# Patient Record
Sex: Male | Born: 1940 | Race: White | Hispanic: No | Marital: Married | Smoking: Former smoker
Health system: Southern US, Community
[De-identification: ages and names within clinical notes are randomized; demographics above are authoritative.]

## PROBLEM LIST (undated history)

## (undated) DIAGNOSIS — K635 Polyp of colon: Secondary | ICD-10-CM

## (undated) DIAGNOSIS — M1990 Unspecified osteoarthritis, unspecified site: Secondary | ICD-10-CM

## (undated) DIAGNOSIS — T7840XA Allergy, unspecified, initial encounter: Secondary | ICD-10-CM

## (undated) DIAGNOSIS — F419 Anxiety disorder, unspecified: Secondary | ICD-10-CM

## (undated) DIAGNOSIS — B029 Zoster without complications: Secondary | ICD-10-CM

## (undated) DIAGNOSIS — C801 Malignant (primary) neoplasm, unspecified: Secondary | ICD-10-CM

## (undated) DIAGNOSIS — K219 Gastro-esophageal reflux disease without esophagitis: Secondary | ICD-10-CM

## (undated) DIAGNOSIS — C679 Malignant neoplasm of bladder, unspecified: Secondary | ICD-10-CM

## (undated) DIAGNOSIS — N400 Enlarged prostate without lower urinary tract symptoms: Secondary | ICD-10-CM

## (undated) DIAGNOSIS — I10 Essential (primary) hypertension: Secondary | ICD-10-CM

## (undated) HISTORY — DX: Anxiety disorder, unspecified: F41.9

## (undated) HISTORY — DX: Unspecified osteoarthritis, unspecified site: M19.90

## (undated) HISTORY — PX: CATARACT EXTRACTION, BILATERAL: SHX1313

## (undated) HISTORY — DX: Gastro-esophageal reflux disease without esophagitis: K21.9

## (undated) HISTORY — PX: TRANSURETHRAL RESECTION OF PROSTATE: SHX73

## (undated) HISTORY — DX: Malignant neoplasm of bladder, unspecified: C67.9

## (undated) HISTORY — DX: Allergy, unspecified, initial encounter: T78.40XA

## (undated) HISTORY — PX: COLONOSCOPY: SHX174

## (undated) HISTORY — PX: OTHER SURGICAL HISTORY: SHX169

## (undated) HISTORY — DX: Malignant (primary) neoplasm, unspecified: C80.1

## (undated) HISTORY — PX: POLYPECTOMY: SHX149

## (undated) HISTORY — DX: Essential (primary) hypertension: I10

## (undated) HISTORY — DX: Zoster without complications: B02.9

## (undated) HISTORY — DX: Polyp of colon: K63.5

## (undated) HISTORY — PX: ELECTROCARDIOGRAM: SHX264

## (undated) HISTORY — DX: Benign prostatic hyperplasia without lower urinary tract symptoms: N40.0

---
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
## 1980-08-21 HISTORY — PX: CHOLECYSTECTOMY: SHX55

## 1995-08-22 HISTORY — PX: DOPPLER ECHOCARDIOGRAPHY: SHX263

## 2005-10-31 ENCOUNTER — Ambulatory Visit: Payer: Self-pay | Admitting: Internal Medicine

## 2005-11-14 ENCOUNTER — Ambulatory Visit: Payer: Self-pay | Admitting: Internal Medicine

## 2005-12-19 ENCOUNTER — Ambulatory Visit: Payer: Self-pay | Admitting: Internal Medicine

## 2006-10-20 HISTORY — PX: OTHER SURGICAL HISTORY: SHX169

## 2006-10-23 ENCOUNTER — Emergency Department (HOSPITAL_COMMUNITY): Admission: EM | Admit: 2006-10-23 | Discharge: 2006-10-23 | Payer: Self-pay | Admitting: Emergency Medicine

## 2006-10-24 ENCOUNTER — Encounter (INDEPENDENT_AMBULATORY_CARE_PROVIDER_SITE_OTHER): Payer: Self-pay | Admitting: Internal Medicine

## 2006-10-25 ENCOUNTER — Encounter (INDEPENDENT_AMBULATORY_CARE_PROVIDER_SITE_OTHER): Payer: Self-pay | Admitting: Internal Medicine

## 2006-10-26 ENCOUNTER — Encounter (INDEPENDENT_AMBULATORY_CARE_PROVIDER_SITE_OTHER): Payer: Self-pay | Admitting: Specialist

## 2006-10-26 ENCOUNTER — Encounter (INDEPENDENT_AMBULATORY_CARE_PROVIDER_SITE_OTHER): Payer: Self-pay | Admitting: Internal Medicine

## 2006-10-26 ENCOUNTER — Observation Stay (HOSPITAL_COMMUNITY): Admission: RE | Admit: 2006-10-26 | Discharge: 2006-10-27 | Payer: Self-pay | Admitting: Urology

## 2006-12-21 ENCOUNTER — Encounter (INDEPENDENT_AMBULATORY_CARE_PROVIDER_SITE_OTHER): Payer: Self-pay | Admitting: Internal Medicine

## 2007-01-24 ENCOUNTER — Ambulatory Visit: Payer: Self-pay | Admitting: Family Medicine

## 2007-01-24 DIAGNOSIS — J189 Pneumonia, unspecified organism: Secondary | ICD-10-CM

## 2007-01-29 ENCOUNTER — Telehealth (INDEPENDENT_AMBULATORY_CARE_PROVIDER_SITE_OTHER): Payer: Self-pay | Admitting: *Deleted

## 2007-02-08 ENCOUNTER — Encounter (INDEPENDENT_AMBULATORY_CARE_PROVIDER_SITE_OTHER): Payer: Self-pay | Admitting: Urology

## 2007-02-08 ENCOUNTER — Ambulatory Visit (HOSPITAL_BASED_OUTPATIENT_CLINIC_OR_DEPARTMENT_OTHER): Admission: RE | Admit: 2007-02-08 | Discharge: 2007-02-08 | Payer: Self-pay | Admitting: Urology

## 2007-02-13 ENCOUNTER — Encounter: Payer: Self-pay | Admitting: Internal Medicine

## 2007-02-13 DIAGNOSIS — I10 Essential (primary) hypertension: Secondary | ICD-10-CM

## 2007-02-13 DIAGNOSIS — Z8639 Personal history of other endocrine, nutritional and metabolic disease: Secondary | ICD-10-CM

## 2007-02-13 DIAGNOSIS — C679 Malignant neoplasm of bladder, unspecified: Secondary | ICD-10-CM

## 2007-02-13 DIAGNOSIS — K219 Gastro-esophageal reflux disease without esophagitis: Secondary | ICD-10-CM | POA: Insufficient documentation

## 2007-02-13 DIAGNOSIS — M1990 Unspecified osteoarthritis, unspecified site: Secondary | ICD-10-CM | POA: Insufficient documentation

## 2007-02-13 DIAGNOSIS — F411 Generalized anxiety disorder: Secondary | ICD-10-CM | POA: Insufficient documentation

## 2007-02-13 DIAGNOSIS — J309 Allergic rhinitis, unspecified: Secondary | ICD-10-CM | POA: Insufficient documentation

## 2007-02-13 DIAGNOSIS — G43109 Migraine with aura, not intractable, without status migrainosus: Secondary | ICD-10-CM | POA: Insufficient documentation

## 2007-02-13 DIAGNOSIS — Z862 Personal history of diseases of the blood and blood-forming organs and certain disorders involving the immune mechanism: Secondary | ICD-10-CM

## 2007-02-13 DIAGNOSIS — N400 Enlarged prostate without lower urinary tract symptoms: Secondary | ICD-10-CM

## 2007-02-19 ENCOUNTER — Ambulatory Visit: Payer: Self-pay | Admitting: Family Medicine

## 2007-03-19 ENCOUNTER — Ambulatory Visit: Payer: Self-pay | Admitting: Family Medicine

## 2007-04-23 ENCOUNTER — Ambulatory Visit: Payer: Self-pay | Admitting: Family Medicine

## 2007-05-17 ENCOUNTER — Encounter (INDEPENDENT_AMBULATORY_CARE_PROVIDER_SITE_OTHER): Payer: Self-pay | Admitting: Internal Medicine

## 2007-05-17 ENCOUNTER — Telehealth (INDEPENDENT_AMBULATORY_CARE_PROVIDER_SITE_OTHER): Payer: Self-pay | Admitting: Internal Medicine

## 2007-10-29 ENCOUNTER — Ambulatory Visit: Payer: Self-pay | Admitting: Family Medicine

## 2007-11-07 ENCOUNTER — Ambulatory Visit: Payer: Self-pay | Admitting: Family Medicine

## 2007-11-11 LAB — CONVERTED CEMR LAB
ALT: 31 units/L (ref 0–53)
Alkaline Phosphatase: 87 units/L (ref 39–117)
BUN: 14 mg/dL (ref 6–23)
Basophils Relative: 0.8 % (ref 0.0–1.0)
Bilirubin, Direct: 0.1 mg/dL (ref 0.0–0.3)
CO2: 33 meq/L — ABNORMAL HIGH (ref 19–32)
Creatinine, Ser: 0.9 mg/dL (ref 0.4–1.5)
Eosinophils Relative: 3.4 % (ref 0.0–5.0)
Glucose, Bld: 102 mg/dL — ABNORMAL HIGH (ref 70–99)
HCT: 46.6 % (ref 39.0–52.0)
Hemoglobin: 15.6 g/dL (ref 13.0–17.0)
LDL Cholesterol: 126 mg/dL — ABNORMAL HIGH (ref 0–99)
Lymphocytes Relative: 34 % (ref 12.0–46.0)
Monocytes Absolute: 0.7 10*3/uL (ref 0.2–0.7)
Monocytes Relative: 8.1 % (ref 3.0–11.0)
Potassium: 4.2 meq/L (ref 3.5–5.1)
RDW: 12.2 % (ref 11.5–14.6)
Total Bilirubin: 1.1 mg/dL (ref 0.3–1.2)
Total Protein: 7.2 g/dL (ref 6.0–8.3)
VLDL: 27 mg/dL (ref 0–40)
WBC: 8.9 10*3/uL (ref 4.5–10.5)

## 2008-01-27 ENCOUNTER — Encounter (INDEPENDENT_AMBULATORY_CARE_PROVIDER_SITE_OTHER): Payer: Self-pay | Admitting: Internal Medicine

## 2008-02-17 ENCOUNTER — Telehealth (INDEPENDENT_AMBULATORY_CARE_PROVIDER_SITE_OTHER): Payer: Self-pay | Admitting: Internal Medicine

## 2008-03-15 IMAGING — CR DG ABDOMEN 1V
1 series · 1 of 1 positions shown · non-contrast
Comparison: None.

CLINICAL DATA: 65 year-old-male with hematuria.  
 ABDOMEN - 1 VIEW:

[view not recorded]
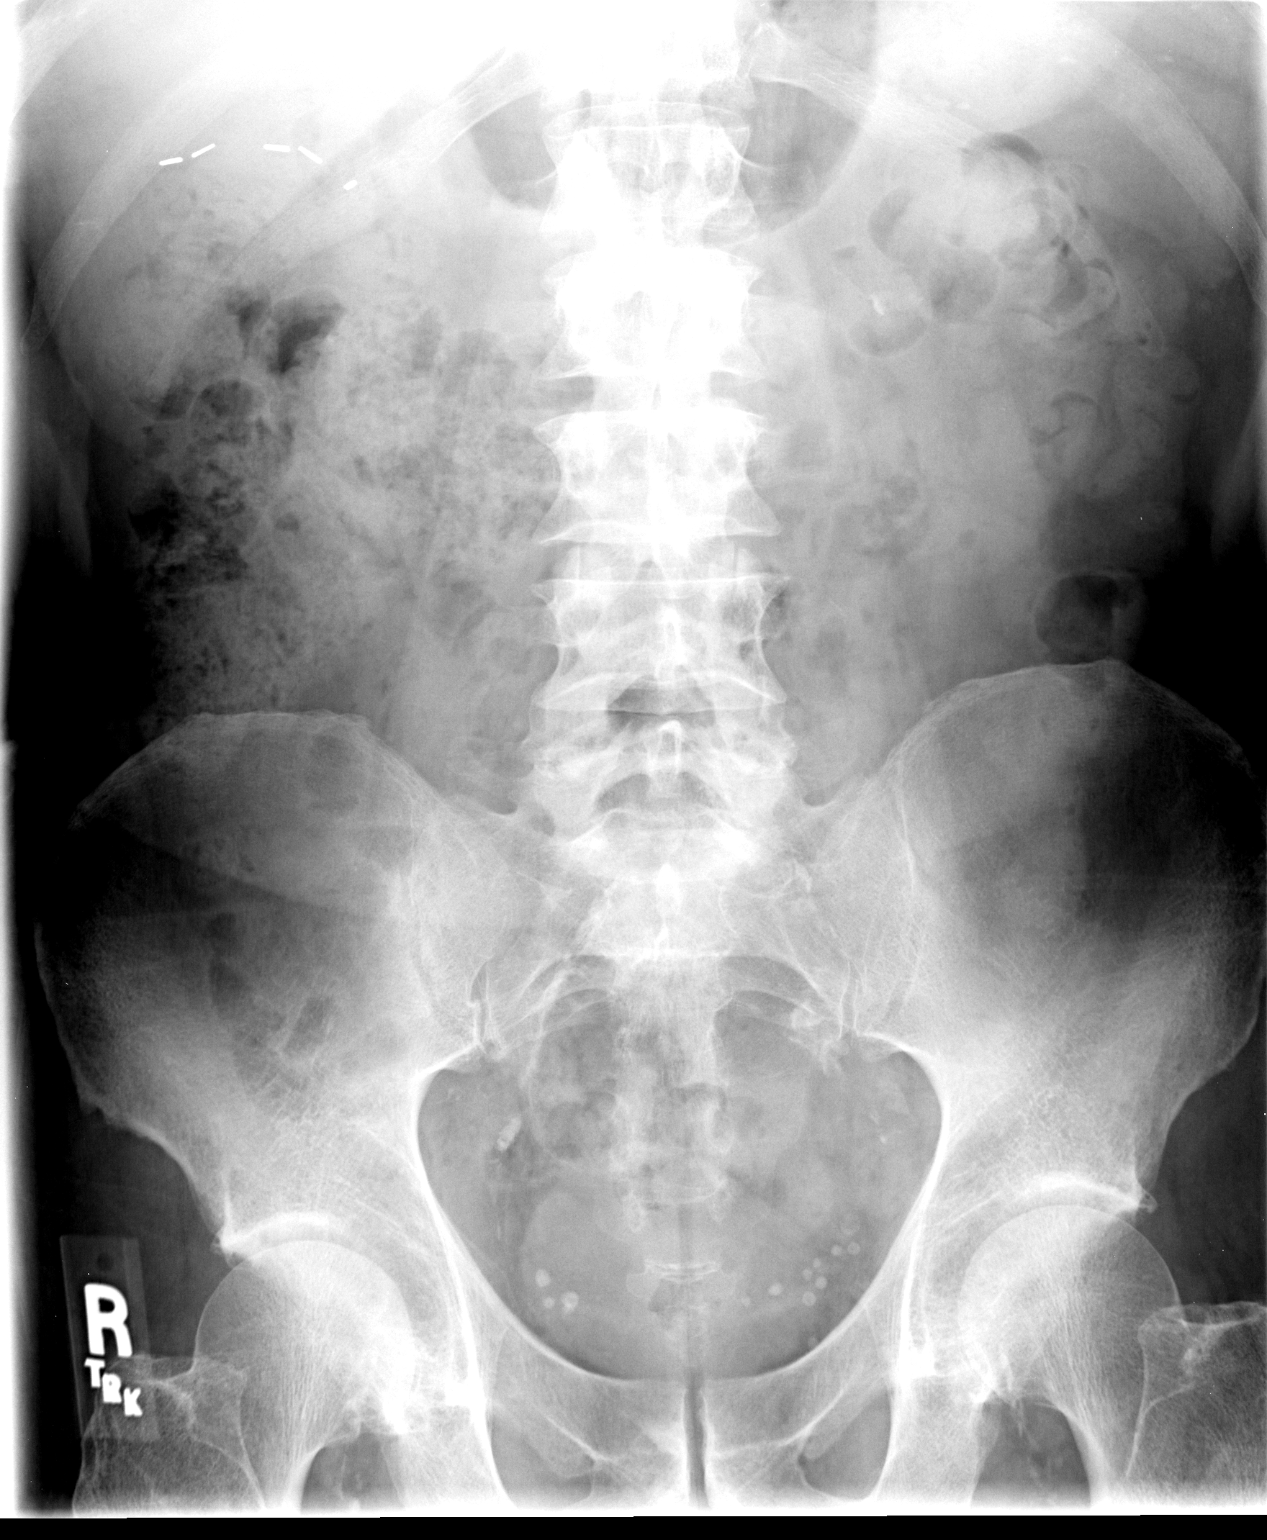

[1 of 1 positions shown; findings below may reference images not displayed]

FINDINGS: Surgical changes are noted within the right upper quadrant from a probable previous cholecystectomy.  There is also a large amount of stool throughout the colon suggesting constipation.  Multiple rounded phlebolithic appearing calcification in the pelvis.  There are also vascular calcifications involving the aorta and iliac vessels.  There is calcification overlying the midpole region in the left kidney.  The right kidney is obscured by stool.  
 Bony structures are unremarkable.  The soft tissue shadows of the abdomen are maintained.
IMPRESSION: 1.  Constipation. 
 2.  Vascular calcifications and phleboliths in the pelvis. 
 3.  Left renal calcification.

## 2008-05-05 ENCOUNTER — Ambulatory Visit: Payer: Self-pay | Admitting: Family Medicine

## 2008-10-27 ENCOUNTER — Telehealth (INDEPENDENT_AMBULATORY_CARE_PROVIDER_SITE_OTHER): Payer: Self-pay | Admitting: Internal Medicine

## 2008-11-03 ENCOUNTER — Encounter (INDEPENDENT_AMBULATORY_CARE_PROVIDER_SITE_OTHER): Payer: Self-pay | Admitting: Internal Medicine

## 2008-11-05 ENCOUNTER — Ambulatory Visit: Payer: Self-pay | Admitting: Family Medicine

## 2008-11-06 LAB — CONVERTED CEMR LAB
ALT: 27 units/L (ref 0–53)
AST: 25 units/L (ref 0–37)
BUN: 17 mg/dL (ref 6–23)
Calcium: 9.8 mg/dL (ref 8.4–10.5)
GFR calc non Af Amer: 101.77 mL/min (ref >60)
Glucose, Bld: 107 mg/dL — ABNORMAL HIGH (ref 70–99)
Sodium: 140 meq/L (ref 135–145)

## 2008-11-13 ENCOUNTER — Encounter (INDEPENDENT_AMBULATORY_CARE_PROVIDER_SITE_OTHER): Payer: Self-pay | Admitting: Internal Medicine

## 2009-01-04 ENCOUNTER — Encounter (INDEPENDENT_AMBULATORY_CARE_PROVIDER_SITE_OTHER): Payer: Self-pay | Admitting: Internal Medicine

## 2009-04-06 ENCOUNTER — Encounter (INDEPENDENT_AMBULATORY_CARE_PROVIDER_SITE_OTHER): Payer: Self-pay | Admitting: Internal Medicine

## 2009-05-11 ENCOUNTER — Ambulatory Visit: Payer: Self-pay | Admitting: Family Medicine

## 2009-05-20 ENCOUNTER — Ambulatory Visit: Payer: Self-pay | Admitting: Family Medicine

## 2009-05-20 DIAGNOSIS — E7800 Pure hypercholesterolemia, unspecified: Secondary | ICD-10-CM

## 2009-05-21 LAB — CONVERTED CEMR LAB
BUN: 15 mg/dL (ref 6–23)
CO2: 31 meq/L (ref 19–32)
Calcium: 9.7 mg/dL (ref 8.4–10.5)
Creatinine, Ser: 1 mg/dL (ref 0.4–1.5)
Glucose, Bld: 113 mg/dL — ABNORMAL HIGH (ref 70–99)
HDL: 42.3 mg/dL (ref >39.00)
Triglycerides: 162 mg/dL — ABNORMAL HIGH (ref 0.0–149.0)

## 2009-07-08 ENCOUNTER — Ambulatory Visit: Payer: Self-pay | Admitting: Family Medicine

## 2009-08-12 ENCOUNTER — Ambulatory Visit: Payer: Self-pay | Admitting: Family Medicine

## 2009-08-12 DIAGNOSIS — J019 Acute sinusitis, unspecified: Secondary | ICD-10-CM

## 2009-09-07 ENCOUNTER — Encounter: Payer: Self-pay | Admitting: Family Medicine

## 2009-09-21 ENCOUNTER — Telehealth: Payer: Self-pay | Admitting: Family Medicine

## 2009-11-05 ENCOUNTER — Ambulatory Visit: Payer: Self-pay | Admitting: Family Medicine

## 2009-11-08 LAB — CONVERTED CEMR LAB
AST: 23 units/L (ref 0–37)
Alkaline Phosphatase: 82 units/L (ref 39–117)
CO2: 33 meq/L — ABNORMAL HIGH (ref 19–32)
Calcium: 9.8 mg/dL (ref 8.4–10.5)
Glucose, Bld: 107 mg/dL — ABNORMAL HIGH (ref 70–99)
Sodium: 140 meq/L (ref 135–145)
Total Bilirubin: 0.8 mg/dL (ref 0.3–1.2)
Total CHOL/HDL Ratio: 4

## 2010-01-11 ENCOUNTER — Encounter: Payer: Self-pay | Admitting: Family Medicine

## 2010-05-12 ENCOUNTER — Ambulatory Visit: Payer: Self-pay | Admitting: Family Medicine

## 2010-05-19 ENCOUNTER — Telehealth: Payer: Self-pay | Admitting: Family Medicine

## 2010-06-03 ENCOUNTER — Encounter: Payer: Self-pay | Admitting: Family Medicine

## 2010-09-20 NOTE — Assessment & Plan Note (Signed)
Summary: 6 months follow  up /lsf R/S FROM 05/13/10   Vital Signs:  Patient profile:   70 year old male Height:      68.5 inches Weight:      160 pounds BMI:     24.06 Temp:     97.6 degrees F oral Pulse rate:   80 / minute Pulse rhythm:   regular BP sitting:   142 / 80  (left arm) Cuff size:   regular  Vitals Entered By: Linde Gillis CMA Duncan Dull) (May 12, 2010 8:53 AM) CC: six month follow up   History of Present Illness: 70 yo here for 6 month follow up.  doing well, playing a lot of golf.  HTN- on Azor 5-40 mg and Norvasc 5 mg daily.  No CP, SOB, blurred vision or LE edema.  H/o bladder CA- seeing Dr. Lonna Cobb.  Last Cystoscopy a fw months ago, negative for recurrence.  Following up with him again next month.  No hematuria, urinary frequency or dysuria.  GERD- has been well contorlled with Prevacid 15 mg daily.    Current Medications (verified): 1)  Prevacid 15 Mg Cpdr (Lansoprazole) .... Take 1 Capsule By Mouth Once A Day 2)  Azor 5-40 Mg  Tabs (Amlodipine-Olmesartan) .Marland Kitchen.. 1 Once Daily 3)  Norvasc 5 Mg Tabs (Amlodipine Besylate) .Marland Kitchen.. 1 Once Daily With Azor  Allergies (verified): No Known Drug Allergies  Past History:  Past Medical History: Last updated: 10/29/2007 Allergic rhinitis Anxiety GERD Hypertension Osteoarthritis Benign prostatic hypertrophy bladder cancer--3/08 shingles 1997  Past Surgical History: Last updated: 02/13/2007 1948    T & A 1982    Cholecystectomy 2003    Colonoscopy (-) 1997    Echo - normal study 8/97     MRI, cervical spine - degen. changes 1997    EKG, Holter monitor, carotid US (-) 3/08     TURBT Earlene Plater)  Family History: Last updated: 10/29/2007 Father: 35, good, age 55 - MI--died just before age 26--76mo after wife died. Mother: August 09, 1989(+) smoker - emphysema Siblings: Brother 41  good, sister 68 good. PGF:  12/20/91HTN CV:  (+) Dad MI HBP:  (+) Gfather, (+) Dad Arthritis:  ? ETOH:  (+) pt.  Social  History: Last updated: 02/13/2007 Marital Status: Married Children: 4--3 girls, 1 boy Occupation: retired Therapist, art:  golf, sports (all kinds) Former Smoker, quit 19 years ago - June Alcohol use-no, 33 years dry, only drinks H20 Drug use-no Regular exercise-yes, golf 3x/week Sleeps 8-9 hours/night, awakens 20 minutes later - and goes back to sleep  Risk Factors: Alcohol Use: 0 (11/05/2008) Caffeine Use: 0 (11/05/2008) Exercise: yes (05/11/2009)  Risk Factors: Smoking Status: quit (11/05/2008) Packs/Day: 1/d (11/05/2008) Cigars/wk: 0 (11/05/2008) Pipe Use/wk: 3-4d/wk---2-3x/q (11/05/2008) Cans of tobacco/wk: 0 (11/05/2008) Passive Smoke Exposure: no (11/05/2008)  Review of Systems      See HPI General:  Denies malaise. Eyes:  Denies blurring. ENT:  Denies difficulty swallowing. CV:  Denies chest pain or discomfort. Resp:  Denies shortness of breath. GI:  Denies abdominal pain, bloody stools, and change in bowel habits. GU:  Denies discharge and dysuria. Derm:  Denies rash. Psych:  Denies anxiety and depression.  Physical Exam  General:  alert, well-developed, well-nourished, and well-hydrated.  thin, NAD Head:  normocephalic and atraumatic.   Eyes:  vision grossly intact, pupils equal, pupils round, and pupils reactive to light.   Ears:  R ear normal and L ear normal.   Nose:  no external deformity.  Mouth:  good dentition.   Lungs:  Normal respiratory effort, chest expands symmetrically. Lungs are clear to auscultation, no crackles or wheezes. Heart:  Normal rate and regular rhythm. S1 and S2 normal without gallop, murmur, click, rub or other extra sounds. Abdomen:  Bowel sounds positive,abdomen soft and non-tender without masses, organomegaly or hernias noted. Msk:  No deformity or scoliosis noted of thoracic or lumbar spine.   Extremities:  No clubbing, cyanosis, edema, or deformity noted with normal full range of motion of all joints.   Neurologic:   alert & oriented X3 and gait normal.   Skin:  Intact without suspicious lesions or rashes Psych:  Cognition and judgment appear intact. Alert and cooperative with normal attention span and concentration. No apparent delusions, illusions, hallucinations   Impression & Recommendations:  Problem # 1:  HYPERTENSION, BENIGN ESSENTIAL (ICD-401.1) Assessment Unchanged Continue on current meds. His updated medication list for this problem includes:    Azor 5-40 Mg Tabs (Amlodipine-olmesartan) .Marland Kitchen... 1 once daily    Norvasc 5 Mg Tabs (Amlodipine besylate) .Marland Kitchen... 1 once daily with azor  Problem # 2:  BLADDER CANCER (ICD-188.9) Assessment: Unchanged Doing well, advised discussing PSA with Dr. Lonna Cobb when he sees him next month.  Problem # 3:  GERD (ICD-530.81) Assessment: Unchanged well controlled on Prevacid. His updated medication list for this problem includes:    Prevacid 15 Mg Cpdr (Lansoprazole) .Marland Kitchen... Take 1 capsule by mouth once a day  Complete Medication List: 1)  Prevacid 15 Mg Cpdr (Lansoprazole) .... Take 1 capsule by mouth once a day 2)  Azor 5-40 Mg Tabs (Amlodipine-olmesartan) .Marland Kitchen.. 1 once daily 3)  Norvasc 5 Mg Tabs (Amlodipine besylate) .Marland Kitchen.. 1 once daily with azor  Other Orders: Flu Vaccine 33yrs + (04540) Admin 1st Vaccine (98119)  Current Allergies (reviewed today): No known allergies   Herpes Zoster Next Due:  Refused   Immunizations Administered:  Influenza Vaccine # 1:    Vaccine Type: Fluvax 3+    Site: left deltoid    Mfr: GlaxoSmithKline    Dose: 0.5 ml    Route: IM    Given by: Linde Gillis CMA (AAMA)    Exp. Date: 02/18/2011    Lot #: [id number]    VIS given: 03/15/10 version given May 12, 2010.

## 2010-09-20 NOTE — Letter (Signed)
Summary: [facility]   Imported By: Lanelle Bal 01/19/2010 10:12:16  _____________________________________________________________________  External Attachment:    Type:   Image     Comment:   External Document  Appended Document: [facility] cystoscopy neg for recurrence.

## 2010-09-20 NOTE — Letter (Signed)
Summary: [facility]  [facility]   Imported By: Lanelle Bal 06/11/2010 11:15:56  _____________________________________________________________________  External Attachment:    Type:   Image     Comment:   External Document  Appended Document: [facility] no evidence of recurrent bladder CA

## 2010-09-20 NOTE — Letter (Signed)
Summary: [facility]   Imported By: Lanelle Bal 09/14/2009 12:55:30  _____________________________________________________________________  External Attachment:    Type:   Image     Comment:   External Document  Appended Document: [facility] No evidence of recurrence of bladder CA.

## 2010-09-20 NOTE — Progress Notes (Signed)
Summary: Rx Prevacid  Phone Note Refill Request Call back at [phone] Message from:  [facility] on September 21, 2009 8:26 AM  Refills Requested: Medication #1:  PREVACID 15 MG CPDR Take 1 capsule by mouth once a day Received faxed refill request, please advise   Method Requested: Fax to Mail Away Pharmacy Initial call taken by: Linde Gillis CMA Duncan Dull),  September 21, 2009 8:26 AM  Follow-up for Phone Call        Okay to give 90 day supply with 3 refills. Follow-up by: Ruthe Mannan MD,  September 21, 2009 8:38 AM  Additional Follow-up for Phone Call Additional follow up Details #1::        Form in your In box, I didn't put that on the message, sorry. Additional Follow-up by: Linde Gillis CMA Duncan Dull),  September 21, 2009 8:58 AM    Additional Follow-up for Phone Call Additional follow up Details #2::    Oh sorry.  It's filled out on my desk.  :) Follow-up by: Ruthe Mannan MD,  September 21, 2009 9:01 AM  Additional Follow-up for Phone Call Additional follow up Details #3:: Details for Additional Follow-up Action Taken: Rx faxed to [facility] [phone]. Additional Follow-up by: Linde Gillis CMA Duncan Dull),  September 21, 2009 9:32 AM

## 2010-09-20 NOTE — Progress Notes (Signed)
Summary: prevacid  Phone Note Refill Request Call back at Home Phone [phone] Message from:  Patient on May 19, 2010 2:24 PM  Refills Requested: Medication #1:  PREVACID 15 MG CPDR Take 1 capsule by mouth once a day He is asking that this be faxed to his mail order pharmacy. [phone]  Initial call taken by: Melody Comas,  May 19, 2010 2:27 PM  Follow-up for Phone Call        In my box to be faxed. Ruthe Mannan MD  May 19, 2010 2:29 PM  Rx faxed to mail order pharmacy at [phone].   Follow-up by: Linde Gillis CMA Duncan Dull),  May 19, 2010 2:35 PM    Prescriptions: PREVACID 15 MG CPDR (LANSOPRAZOLE) Take 1 capsule by mouth once a day  #90 x 3   Entered and Authorized by:   Ruthe Mannan MD   Signed by:   Ruthe Mannan MD on 05/19/2010   Method used:   Print then Give to Patient   RxID:   [id number]

## 2010-11-23 ENCOUNTER — Telehealth: Payer: Self-pay | Admitting: *Deleted

## 2010-11-23 NOTE — Telephone Encounter (Signed)
Patient's wife states that she pulled tick off of his thigh yesterday. Today there is a red knot at the site. I advised wife that those signs are not unusual for a tick bite, and told her to look for flu like symptoms, rash. She refused to listen, she wanted him to be seen today, I advised her to take him to Urgent care due to not having any appts.  She says that she will not taking him to an Urgent Care because he is a patient of Dr. Dayton Martes and he should not have to stoop so low as to going to an Urgent Care. She is asking if he can have an antibiotic called in. Uses [facility].

## 2010-11-23 NOTE — Telephone Encounter (Signed)
Antibiotics are not needed for ticks when just pulled off.  Be on the lookout for systemic fevers, aches, general flu-like symptoms. If these develop, then treatment is absolutely needed, but if no systemic symptoms, should not need ABX.

## 2010-11-23 NOTE — Telephone Encounter (Signed)
Patient advised as instructed via telephone.  Advised him that if he developed any of the symptoms mentioned by Dr.  Patsy Lager to either give [place] a call right away or go to the ER.

## 2011-01-03 NOTE — Op Note (Signed)
NAMEBODHI, Kramer NO.:  [id number]   MEDICAL RECORD NO.:  [id number]          PATIENT TYPE:  AMB   LOCATION:  [facility]                         FACILITY:  [facility]   PHYSICIAN:  Terie Purser, MD         DATE OF BIRTH:  28-Mar-1941   DATE OF PROCEDURE:  02/08/2007  DATE OF DISCHARGE:                               OPERATIVE REPORT   PREOPERATIVE DIAGNOSIS:  Bladder cancer.   POSTOPERATIVE DIAGNOSIS:  Bladder cancer.   PROCEDURE:  Cystoscopy with bladder biopsy.   SURGEON:  [name], M.D.   RESIDENT:  Terie Purser, M.D.   ANESTHESIA:  General.   COMPLICATIONS:  None.   SPECIMENS:  Left lateral wall bladder biopsy to pathology.   DISPOSITION:  Stable to postanesthesia care unit.   INDICATIONS FOR PROCEDURE:  [name] is a 70 year old gentleman who  has a history of low-grade multifocal transitional cell carcinoma.  He  is status post TURBT in March of this year.  He has undergone a 6-week  course of BCG treatment.  He returns to the operating room today for  cystoscopic evaluation with bladder biopsy.  The benefits and risks of  the procedure were explained, and consent was obtained.   DESCRIPTION OF PROCEDURE:  The patient was brought to the operating  room, and he was properly identified.  A timeout was performed to  confirm correct patient and procedure.  He was administered general  anesthesia, given preoperative antibiotics, and placed in the dorsal  lithotomy position and prepped and draped in a sterile manner.   Cystoscopy was then performed using the 12 and 70-degree lens with the  22-French cystoscope sheath.  His anterior urethra was normal.  There  was a slight narrowing of the proximal penile urethra just distal to the  bulb, but this accepted the 22-French scope easily.  The posterior  urethra exhibited bilobar hypertrophy.  Systematic examination of the  bladder was then performed with both the 12 and 70-degree lens.  We did  not see  evidence of any recurrence of his tumor.  Both ureteral orifices  were noted in their normal anatomic position and both were seen to be  effluxing clear urine.  There was no other evidence of foreign body or  stone in the bladder.  We did elect to take a bladder biopsy from the  left lateral wall.  There was a slightly reddened area of mucosa, and  this was biopsied using the cold cup biopsy forceps.  The biopsy site  was then fulgurated with the Bugbee electrode.  Hemostasis was  excellent.  We  then drained the bladder.  The cystoscope was removed, and the procedure  was terminated.  There were no complications.   Please note that Dr. Earlene Plater was present and participated in all aspects  of this procedure.           ______________________________  Terie Purser, MD     JH/MEDQ  D:  02/08/2007  T:  02/08/2007  Job:  [id number]

## 2011-01-06 NOTE — Assessment & Plan Note (Signed)
[facility]                                 ON-CALL NOTE   IRBIN, FINES                      MRN:          [id number]  DATE:08/13/2009                            DOB:          Aug 19, 1941    DATE AND TIME:  On August 13, 2009, at 1:15 p.m.   PHONE NUMBER:  [phone].   CALLER:  Wife.   REGULAR DOCTOR:  Dr. Alphonsus Sias is his regular doctor.  I am Dr. Milinda Antis on-  call.   CHIEF COMPLAINT:  Coughing up blood.   The patient's wife states he has been sick with bronchitis and upper  respiratory tract infection.  He has been on several different  antibiotics, the last one being erythromycin.  He was seen fairly  recently by Dr. Dayton Martes.  Today, he feels worse, his throat is hurting  more, and he is coughing up some blood.  I advised her to take him to  Urgent Care or the emergency room for evaluation now as he may need a  chest x-ray and that is what she is going to do.     Marne A. Tower, MD     MAT/MedQ  DD: 08/13/2009  DT: 08/13/2009  Job #: [id number]

## 2011-01-06 NOTE — Op Note (Signed)
NAMELAKEITH, CAREAGA NO.:  [id number]   MEDICAL RECORD NO.:  [id number]          PATIENT TYPE:  INP   LOCATION:  1424                         FACILITY:  [facility]   PHYSICIAN:  Ronald L. Earlene Plater, M.D.  DATE OF BIRTH:  [birth year]   DATE OF PROCEDURE:  10/26/2006  DATE OF DISCHARGE:                               OPERATIVE REPORT   DIAGNOSIS:  Multifocal bladder tumors.   OPERATIVE PROCEDURES:  1. Cystourethroscopy.  2. Transurethral resection of bladder tumors.   SURGEON:  [name], M.D.   ANESTHESIA:  General endotracheal.   ESTIMATED BLOOD LOSS:  Negligible.   TUBES:  A 22-French 10 mL balloon Foley.   COMPLICATIONS:  None.   INDICATIONS FOR PROCEDURE:  Mr. Dorton is a very nice 70 year old white  male who presented with gross hematuria on  cystourethroscopy. He was  found to have what was felt to be a large multifocal bladder tumor.  It  was felt that TURBT was indicated.  After understanding risks, benefits  and alternative like to proceed. CT scan of the abdomen and pelvis  revealed no evidence of metastatic disease.   PROCEDURE IN DETAIL:  The patient was placed in supine position.  After  proper LMA anesthesia, he was placed in the dorsal lithotomy position,  prepped and draped with Betadine in a sterile fashion.  He was  subsequently intubated and with Anectine was paralyzed due to the  tumor's proximity to the obturator nerve. The urethra was calibrated to  30-French and a 28-French Storz continuous flow resectoscope sheath was  placed over a Timberlake obturator.  Inspection of bladder revealed that  he had a very large what appeared to be superficial tumor, overlying the  right trigone with a separate tumor at the right lateral bladder wall.  The area was very diffuse and an ampule of indigo carmine was given IV  so the ureteral orifices could be well visualized.  The tumor was then  resected and submitted to pathology.  No perforations were  noted.  It  was primarily right lateral bladder wall but it did extend to the  trigone but the ureteral orifice was avoided.  Cold cup biopsy forceps  were also obtained of it and good hemostasis was obtained with cautery.  The right ureteral orifice was intact as was the left ureteral orifice.  The remainder of the bladder appeared to be clear.  Specimens were  submitted pathology. The bladder was drained.  The panendoscope was  removed. A 22-French 10 mL balloon Foley catheter was passed. The  bladder was noted to irrigate clear and the patient was taken to the  recovery room stable.      Ronald L. Earlene Plater, M.D.  Electronically Signed     RLD/MEDQ  D:  10/26/2006  T:  10/27/2006  Job:  [id number]

## 2011-05-24 ENCOUNTER — Telehealth: Payer: Self-pay | Admitting: Internal Medicine

## 2011-05-24 ENCOUNTER — Encounter: Payer: Self-pay | Admitting: Family Medicine

## 2011-05-24 ENCOUNTER — Ambulatory Visit (INDEPENDENT_AMBULATORY_CARE_PROVIDER_SITE_OTHER): Payer: Medicare PPO | Admitting: Family Medicine

## 2011-05-24 VITALS — BP 140/70 | HR 90 | Temp 97.6°F | Ht 68.0 in | Wt 155.8 lb

## 2011-05-24 DIAGNOSIS — K625 Hemorrhage of anus and rectum: Secondary | ICD-10-CM

## 2011-05-24 LAB — CBC WITH DIFFERENTIAL/PLATELET
Basophils Relative: 0.2 % (ref 0.0–3.0)
Eosinophils Absolute: 0 10*3/uL (ref 0.0–0.7)
Lymphs Abs: 1.3 10*3/uL (ref 0.7–4.0)
MCHC: 33.7 g/dL (ref 30.0–36.0)
MCV: 92.8 fl (ref 78.0–100.0)
Monocytes Absolute: 0.6 10*3/uL (ref 0.1–1.0)
Neutro Abs: 8.3 10*3/uL — ABNORMAL HIGH (ref 1.4–7.7)
Neutrophils Relative %: 81.2 % — ABNORMAL HIGH (ref 43.0–77.0)
RBC: 4.74 Mil/uL (ref 4.22–5.81)

## 2011-05-24 NOTE — Telephone Encounter (Signed)
Patient has history of colon 10 years ago according to Dr Cyndie Chime note.  I have asked Shirlee Limerick to have the patient think about where this was done so we can try and get records.  He will come in tomorrow and see Willette Cluster RNP at9:30

## 2011-05-24 NOTE — Patient Instructions (Signed)
REFERRAL: GO THE THE FRONT ROOM AT THE ENTRANCE OF OUR CLINIC, NEAR CHECK IN. ASK FOR MARION. SHE WILL HELP YOU SET UP YOUR REFERRAL. DATE: TIME:  

## 2011-05-24 NOTE — Progress Notes (Signed)
  Subjective:    Patient ID: Michael Kramer, male    DOB: 08/12/1941, 70 y.o.   MRN: [id number]  HPI  Michael Kramer, a 70 y.o. male presents today in the office for the following:    Very pleasant 70 year old gentleman with a history of bladder cancer who presents with some acute rectal bleeding today on [place] way [place] the [place]. He noticed some blood on the toilet paper as well as potentially some blood in the stool. Also he may have had some blood in the toilet water but he is not clear about that. He does have a history of hemorrhoids diagnosed 30 years ago, when he had a prior episode of bleeding. He did have a colonoscopy approximately 10 years ago or so, and reports that this was within normal limits, but results are not available for our review. He does not feel faint or lightheaded.  A fair amount of blood in the toilet -- does have a history of hemorrhoids. 30 years ago.  No pain.  A little gassy.  No colonoscopy in at least 10 years. In [place]. Normal per report.   The PMH, PSH, Social History, Family History, Medications, and allergies have been reviewed in [facility], and have been updated if relevant.   Review of Systems ROS: GEN: No acute illnesses, no fevers, chills. GI: No n/v/d, eating normally Pulm: No SOB Interactive and getting along well at home.  Otherwise, ROS is as per the HPI.     Objective:   Physical Exam   Physical Exam  Blood pressure 140/70, pulse 90, temperature 97.6 F (36.4 C), temperature source Oral, height 5\' 8"  (1.727 m), weight 155 lb 12.8 oz (70.67 kg), SpO2 96.00%.  GEN: WDWN, NAD, Non-toxic, A & O x 3 HEENT: Atraumatic, Normocephalic. Neck supple. No masses, No LAD. Ears and Nose: No external deformity. CV: RRR, No M/G/R. No JVD. No thrill. No extra heart sounds. PULM: CTA B, no wheezes, crackles, rhonchi. No retractions. No resp. distress. No accessory muscle use. Rectal: Good tone. The patient does have external hemorrhoids. Prostate is  mildly enlarged. Hemoccult positive. ABD: S, NT, ND, +BS. No rebound tenderness. No HSM.  EXTR: No c/c/e NEURO Normal gait.  PSYCH: Normally interactive. Conversant. Not depressed or anxious appearing.  Calm demeanor.        Assessment & Plan:   1. Rectal bleeding  CBC with Differential, Ambulatory referral to Gastroenterology    Gross blood on bowel movement as well as Hemoccult positive. 70 year old patient with last colonoscopy 10 years ago. Discussed potential causes for this with the patient, but colonoscopy and gastroenterology consult is needed in this scenario. I appreciate their input.

## 2011-05-25 ENCOUNTER — Ambulatory Visit (INDEPENDENT_AMBULATORY_CARE_PROVIDER_SITE_OTHER): Payer: Medicare PPO | Admitting: Nurse Practitioner

## 2011-05-25 ENCOUNTER — Encounter: Payer: Self-pay | Admitting: Nurse Practitioner

## 2011-05-25 VITALS — BP 98/60 | HR 88 | Ht 68.0 in | Wt 152.0 lb

## 2011-05-25 DIAGNOSIS — K219 Gastro-esophageal reflux disease without esophagitis: Secondary | ICD-10-CM | POA: Insufficient documentation

## 2011-05-25 DIAGNOSIS — R198 Other specified symptoms and signs involving the digestive system and abdomen: Secondary | ICD-10-CM

## 2011-05-25 DIAGNOSIS — Z1211 Encounter for screening for malignant neoplasm of colon: Secondary | ICD-10-CM

## 2011-05-25 DIAGNOSIS — R194 Change in bowel habit: Secondary | ICD-10-CM

## 2011-05-25 DIAGNOSIS — K625 Hemorrhage of anus and rectum: Secondary | ICD-10-CM | POA: Insufficient documentation

## 2011-05-25 MED ORDER — PEG-KCL-NACL-NASULF-NA ASC-C 100 G PO SOLR: 1.0000 | Freq: Once | ORAL | Status: DC

## 2011-05-25 NOTE — Progress Notes (Signed)
05/25/2011 ARAF CLUGSTON [id number] 26-May-1941   HISTORY OF PRESENT ILLNESS: 70 year old male diagnosed with a history of bladder cancer diagnosed in 2008 and treated with tuberculosis therapy. Yesterday on the way to golf he felt sudden urge to defecate, passed bright red blood. No BMs or bleeding prior to or since that episode. No abdominal pain. No rectal pain. Weight is stable. Bowel movement had always been regular until about three weeks ago when started developing altered bowel patterns. Stools now vary between solid and loose, 1-4 times a day. Patient isn't sure if he has ever had cololonoscopy. HE does have a longstanding history of GERD, asymptomatic on daily PPI.    Past Medical History  Diagnosis Date  . Allergy   . Anxiety   . GERD (gastroesophageal reflux disease)   . Hypertension   . Arthritis   . Cancer     bladder  . BPH (benign prostatic hypertrophy)   . Shingles    Past Surgical History  Procedure Date  . Cholecystectomy 1982  . Tonsillectomy and adenoidectomy 1948  . Doppler echocardiography 1997    normal  . Electrocardiogram   . Holter moniter     normal  . Cartoid     normal  . Turbt     reports that he quit smoking about 24 years ago. His smoking use included Cigarettes. He has never used smokeless tobacco. He reports that he does not drink alcohol or use illicit drugs. family history includes Emphysema in his mother and Hypertension in his paternal grandfather. No Known Allergies    Outpatient Encounter Prescriptions as of 05/25/2011  Medication Sig Dispense Refill  . amLODipine (NORVASC) 5 MG tablet Take 5 mg by mouth daily.        Marland Kitchen amLODipine-olmesartan (AZOR) 5-40 MG per tablet Take 1 tablet by mouth daily.        . lansoprazole (PREVACID) 15 MG capsule Take 15 mg by mouth daily.         REVIEW OF SYSTEMS  : All other systems reviewed and negative except where noted in the History of Present Illness.  PHYSICAL EXAM: General: Well developed  white male in no acute distress Head: Normocephalic and atraumatic Eyes:  sclerae anicteric,conjunctive pink. Ears: Normal auditory acuity Mouth: No deformity or lesions Neck: Supple, no masses.  Lungs: Clear throughout to auscultation Heart: Regular rate and rhythm; no murmurs heard Abdomen: Soft, non distended, nontender. No masses or hepatomegaly noted. Normal Bowel sounds Rectal: Not done Musculoskeletal: Symmetrical with no gross deformities  Skin: No lesions on visible extremities Extremities: No edema or deformities noted Neurological: Alert oriented x 4, grossly nonfocal Cervical Nodes:  No significant cervical adenopathy Psychological:  Alert and cooperative. Normal mood and affect  ASSESSMENT AND PLAN;

## 2011-05-25 NOTE — Patient Instructions (Signed)
You have been scheduled for a Colonoscopy with Dr. Arlyce Dice. See separate instructions. Pick up your prep kit from your pharmacy.  cc: Hannah Beat, MD

## 2011-05-28 ENCOUNTER — Encounter: Payer: Self-pay | Admitting: Nurse Practitioner

## 2011-05-28 DIAGNOSIS — R194 Change in bowel habit: Secondary | ICD-10-CM | POA: Insufficient documentation

## 2011-05-28 NOTE — Assessment & Plan Note (Signed)
Refer to "bowel change".

## 2011-05-28 NOTE — Assessment & Plan Note (Addendum)
Three to four week history of bowel changes and an episode of painless rectal bleeding yesterday. Patient hasn't had a screening colonoscopy before, he needs a colonoscopy now for further evaluation of symptoms. The risks, benefits, and alternatives to colonoscopy with possible biopsy and possible polypectomy were discussed with the patient and they consent to proceed.

## 2011-05-29 ENCOUNTER — Ambulatory Visit (AMBULATORY_SURGERY_CENTER): Payer: Medicare PPO | Admitting: Gastroenterology

## 2011-05-29 ENCOUNTER — Encounter: Payer: Self-pay | Admitting: Gastroenterology

## 2011-05-29 DIAGNOSIS — K5730 Diverticulosis of large intestine without perforation or abscess without bleeding: Secondary | ICD-10-CM

## 2011-05-29 DIAGNOSIS — D126 Benign neoplasm of colon, unspecified: Secondary | ICD-10-CM

## 2011-05-29 DIAGNOSIS — Z1211 Encounter for screening for malignant neoplasm of colon: Secondary | ICD-10-CM

## 2011-05-29 MED ORDER — SODIUM CHLORIDE 0.9 % IV SOLN: 500.0000 mL | INTRAVENOUS | Status: DC

## 2011-05-29 NOTE — Progress Notes (Signed)
Reviewed and agree with management. Latrece Nitta D. Melba Araki, M.D., FACG  

## 2011-05-29 NOTE — Assessment & Plan Note (Signed)
May have been perianal bleeding from irritation related to recent bowel changes. Further recommendations at time of colonoscopy.

## 2011-05-29 NOTE — Assessment & Plan Note (Signed)
Lonstanding history of GERD, doing well on PPI. At some point an EGD for Barrett's screening is reasonable.

## 2011-05-29 NOTE — Progress Notes (Signed)
Pt tolerated the colon exam very well. Maw  Trial of Olympas scope #364. maw

## 2011-05-29 NOTE — Patient Instructions (Addendum)
Polyps, Colon  A polyp is extra tissue that grows inside your body. Colon polyps grow in the large intestine. The large intestine, also called the colon, is part of your digestive system. It is a long, hollow tube at the end of your digestive tract where your body makes and stores stool. Most polyps are not dangerous. They are benign. This means they are not cancerous. But over time, some types of polyps can turn into cancer. Polyps that are smaller than a pea are usually not harmful. But larger polyps could someday become or may already be cancerous. To be safe, doctors remove all polyps and test them.  WHO GETS POLYPS? Anyone can get polyps, but certain people are more likely than others. You may have a greater chance of getting polyps if:  You are over 50.   You have had polyps before.   Someone in your family has had polyps.   Someone in your family has had cancer of the large intestine.   Find out if someone in your family has had polyps. You may also be more likely to get polyps if you:   Eat a lot of fatty foods   Smoke   Drink alcohol   Do not exercise  Eat too much  SYMPTOMS Most small polyps do not cause symptoms. People often do not know they have one until their caregiver finds it during a regular checkup or while testing them for something else. Some people do have symptoms like these:  Bleeding from the anus. You might notice blood on your underwear or on toilet paper after you have had a bowel movement.   Constipation or diarrhea that lasts more than a week.   Blood in the stool. Blood can make stool look black or it can show up as red streaks in the stool.  If you have any of these symptoms, see your caregiver. HOW DOES THE DOCTOR TEST FOR POLYPS? The doctor can use four tests to check for polyps:  Digital rectal exam. The caregiver wears gloves and checks your rectum (the last part of the large intestine) to see if it feels normal. This test would find polyps only  in the rectum. Your caregiver may need to do one of the other tests listed below to find polyps higher up in the intestine.   Barium enema. The caregiver puts a liquid called barium into your rectum before taking x-rays of your large intestine. Barium makes your intestine look white in the pictures. Polyps are dark, so they are easy to see.   Sigmoidoscopy. With this test, the caregiver can see inside your large intestine. A thin flexible tube is placed into your rectum. The device is called a sigmoidoscope, which has a light and a tiny video camera in it. The caregiver uses the sigmoidoscope to look at the last third of your large intestine.   Colonoscopy. This test is like sigmoidoscopy, but the caregiver looks at all of the large intestine. It usually requires sedation. This is the most common method for finding and removing polyps.  TREATMENT  The caregiver will remove the polyp during sigmoidoscopy or colonoscopy. The polyp is then tested for cancer.   If you have had polyps, your caregiver may want you to get tested regularly in the future.  PREVENTION There is not one sure way to prevent polyps. You might be able to lower your risk of getting them if you:  Eat more fruits and vegetables and less fatty food.     Do not smoke.   Avoid alcohol.   Exercise every day.   Lose weight if you are overweight.   Eating more calcium and folate can also lower your risk of getting polyps. Some foods that are rich in calcium are milk, cheese, and broccoli. Some foods that are rich in folate are chickpeas, kidney beans, and spinach.   Aspirin might help prevent polyps. Studies are under way.  Document Released: 05/03/2004 Document Re-Released: 01/25/2010 [facility] Patient Information 2011 [place], [place].  Please follow discharge instructions given today. See handouts. Resume current medications. Call us with any questions or concerns. Repeat colonoscopy in 1 year. No aspirin or advil,aleve  products for 2 weeks. Tylenol okay to use.

## 2011-05-30 ENCOUNTER — Telehealth: Payer: Self-pay | Admitting: *Deleted

## 2011-05-30 NOTE — Telephone Encounter (Signed)
Message left for the patient

## 2011-06-01 ENCOUNTER — Telehealth: Payer: Self-pay | Admitting: Gastroenterology

## 2011-06-01 NOTE — Telephone Encounter (Signed)
Discussed pathology results over the phone with pt, letter was mailed out yesterday to pt regarding results.

## 2011-06-01 NOTE — Telephone Encounter (Signed)
Left message for pt to call back  °

## 2011-06-07 LAB — I-STAT 8, (EC8 V) (CONVERTED LAB)
Bicarbonate: 32.7 — ABNORMAL HIGH
Glucose, Bld: 124 — ABNORMAL HIGH
Sodium: 139
TCO2: 34
pCO2, Ven: 55.3 — ABNORMAL HIGH
pH, Ven: 7.38 — ABNORMAL HIGH

## 2011-07-05 ENCOUNTER — Ambulatory Visit: Payer: Medicare PPO | Admitting: Family Medicine

## 2011-07-06 ENCOUNTER — Ambulatory Visit (INDEPENDENT_AMBULATORY_CARE_PROVIDER_SITE_OTHER): Payer: Medicare PPO | Admitting: Family Medicine

## 2011-07-06 ENCOUNTER — Other Ambulatory Visit: Payer: Self-pay | Admitting: *Deleted

## 2011-07-06 ENCOUNTER — Encounter: Payer: Self-pay | Admitting: Family Medicine

## 2011-07-06 VITALS — BP 128/70 | HR 76 | Temp 97.8°F | Ht 68.0 in | Wt 152.2 lb

## 2011-07-06 DIAGNOSIS — Z125 Encounter for screening for malignant neoplasm of prostate: Secondary | ICD-10-CM

## 2011-07-06 DIAGNOSIS — I10 Essential (primary) hypertension: Secondary | ICD-10-CM

## 2011-07-06 DIAGNOSIS — Z23 Encounter for immunization: Secondary | ICD-10-CM

## 2011-07-06 MED ORDER — LANSOPRAZOLE 15 MG PO CPDR: 15.0000 mg | DELAYED_RELEASE_CAPSULE | Freq: Every day | ORAL | Status: DC

## 2011-07-06 MED ORDER — AMLODIPINE BESYLATE 5 MG PO TABS: 5.0000 mg | ORAL_TABLET | Freq: Every day | ORAL | Status: DC

## 2011-07-06 MED ORDER — AMLODIPINE-OLMESARTAN 5-40 MG PO TABS: 1.0000 | ORAL_TABLET | Freq: Every day | ORAL | Status: DC

## 2011-07-06 NOTE — Progress Notes (Signed)
70 yo here for 6 month follow up.   Doing well, excited to see his family for Thanksgiving.  Travelling to [place], [place].  HTN- on Azor 5-40 mg and Norvasc 5 mg daily. No CP, SOB, blurred vision or LE edema.   H/o bladder CA- seeing urology twice yearly. Last Cystoscopy in 11/2010, negative for recurrence.   No hematuria, urinary frequency or dysuria.   GERD- has been well contorlled with Prevacid 15 mg daily.   Patient Active Problem List  Diagnoses  . BLADDER CANCER  . PURE HYPERCHOLESTEROLEMIA  . ANXIETY  . MIGRAINE WITH AURA  . HYPERTENSION, BENIGN ESSENTIAL  . ACUTE SINUSITIS, UNSPECIFIED  . Allergic Rhinitis, Cause Unspecified  . PNEUMONIA  . GERD  . BENIGN PROSTATIC HYPERTROPHY  . OSTEOARTHRITIS  . GLUCOSE INTOLERANCE, HX OF  . Screening for colon cancer  . GERD (gastroesophageal reflux disease)  . Rectal bleeding  . Bowel habit changes   Past Medical History  Diagnosis Date  . Allergy   . Anxiety   . GERD (gastroesophageal reflux disease)   . Hypertension   . Arthritis   . Cancer     bladder  . BPH (benign prostatic hypertrophy)   . Shingles    Past Surgical History  Procedure Date  . Cholecystectomy 1982  . Tonsillectomy and adenoidectomy 1948  . Doppler echocardiography 1997    normal  . Electrocardiogram   . Holter moniter     normal  . Cartoid     normal  . Turbt    History  Substance Use Topics  . Smoking status: Former Smoker    Types: Cigarettes    Quit date: 01/20/1987  . Smokeless tobacco: Never Used  . Alcohol Use: No   Family History  Problem Relation Age of Onset  . Emphysema Mother     smoker  . Hypertension Paternal Grandfather    No Known Allergies Current Outpatient Prescriptions on File Prior to Visit  Medication Sig Dispense Refill  . amLODipine (NORVASC) 5 MG tablet Take 5 mg by mouth daily.        Marland Kitchen amLODipine-olmesartan (AZOR) 5-40 MG per tablet Take 1 tablet by mouth daily.        . lansoprazole (PREVACID) 15 MG  capsule Take 15 mg by mouth daily.         The PMH, PSH, Social History, Family History, Medications, and allergies have been reviewed in [facility], and have been updated if relevant.  Review of Systems  See HPI  General: Denies malaise.  Eyes: Denies blurring.  ENT: Denies difficulty swallowing.  CV: Denies chest pain or discomfort.  Resp: Denies shortness of breath.  GI: Denies abdominal pain, bloody stools, and change in bowel habits.  GU: Denies discharge and dysuria.  Derm: Denies rash.  Psych: Denies anxiety and depression.  Physical Exam  BP 128/70  Pulse 76  Temp(Src) 97.8 F (36.6 C) (Oral)  Ht 5\' 8"  (1.727 m)  Wt 152 lb 4 oz (69.06 kg)  BMI 23.15 kg/m2  General: alert, well-developed, well-nourished, and well-hydrated. thin, NAD  Head: normocephalic and atraumatic.  Eyes: vision grossly intact, pupils equal, pupils round, and pupils reactive to light.  Ears: R ear normal and L ear normal.  Nose: no external deformity.  Mouth: good dentition.  Lungs: Normal respiratory effort, chest expands symmetrically. Lungs are clear to auscultation, no crackles or wheezes.  Heart: Normal rate and regular rhythm. S1 and S2 normal without gallop, murmur, click, rub or other extra sounds.  Abdomen: Bowel sounds positive,abdomen soft and non-tender without masses, organomegaly or hernias noted.  Msk: No deformity or scoliosis noted of thoracic or lumbar spine.  Extremities: No clubbing, cyanosis, edema, or deformity noted with normal full range of motion of all joints.  Neurologic: alert & oriented X3 and gait normal.  Skin: Intact without suspicious lesions or rashes  Psych: Cognition and judgment appear intact. Alert and cooperative with normal attention span and concentration. No apparent delusions, illusions, hallucinations   Assessment and Plan:  1. HYPERTENSION, BENIGN ESSENTIAL  Stable.  Continue current meds.   2. Screening for prostate cancer  PSA

## 2011-07-06 NOTE — Patient Instructions (Signed)
Great to see you. Have a wonderful Thanksgiving with your family. I hope you don't get snowed in! Call me when you get back to schedule the remainder of your blood work.

## 2011-07-10 ENCOUNTER — Encounter: Payer: Self-pay | Admitting: *Deleted

## 2011-07-11 ENCOUNTER — Encounter: Payer: Medicare PPO | Admitting: Family Medicine

## 2011-11-09 ENCOUNTER — Other Ambulatory Visit: Payer: Self-pay | Admitting: Family Medicine

## 2011-11-09 ENCOUNTER — Other Ambulatory Visit (INDEPENDENT_AMBULATORY_CARE_PROVIDER_SITE_OTHER): Payer: Medicare PPO

## 2011-11-09 DIAGNOSIS — E7800 Pure hypercholesterolemia, unspecified: Secondary | ICD-10-CM

## 2011-11-09 DIAGNOSIS — I10 Essential (primary) hypertension: Secondary | ICD-10-CM

## 2011-11-09 LAB — LIPID PANEL
Cholesterol: 158 mg/dL (ref 0–200)
LDL Cholesterol: 84 mg/dL (ref 0–99)
Triglycerides: 121 mg/dL (ref 0.0–149.0)
VLDL: 24.2 mg/dL (ref 0.0–40.0)

## 2011-11-09 LAB — COMPREHENSIVE METABOLIC PANEL
Albumin: 4 g/dL (ref 3.5–5.2)
BUN: 15 mg/dL (ref 6–23)
CO2: 28 mEq/L (ref 19–32)
Calcium: 9.6 mg/dL (ref 8.4–10.5)
Chloride: 100 mEq/L (ref 96–112)
Glucose, Bld: 107 mg/dL — ABNORMAL HIGH (ref 70–99)
Potassium: 3.4 mEq/L — ABNORMAL LOW (ref 3.5–5.1)
Total Protein: 7.1 g/dL (ref 6.0–8.3)

## 2012-05-21 ENCOUNTER — Encounter: Payer: Self-pay | Admitting: Gastroenterology

## 2012-06-03 ENCOUNTER — Ambulatory Visit (AMBULATORY_SURGERY_CENTER): Payer: Medicare PPO | Admitting: *Deleted

## 2012-06-03 VITALS — Ht 68.0 in | Wt 157.0 lb

## 2012-06-03 DIAGNOSIS — Z1211 Encounter for screening for malignant neoplasm of colon: Secondary | ICD-10-CM

## 2012-06-03 MED ORDER — NA SULFATE-K SULFATE-MG SULF 17.5-3.13-1.6 GM/177ML PO SOLN: ORAL | Status: DC

## 2012-06-17 ENCOUNTER — Encounter: Payer: Medicare PPO | Admitting: Gastroenterology

## 2012-06-20 ENCOUNTER — Encounter: Payer: Medicare PPO | Admitting: Gastroenterology

## 2012-06-27 DIAGNOSIS — Z8551 Personal history of malignant neoplasm of bladder: Secondary | ICD-10-CM | POA: Insufficient documentation

## 2012-07-02 ENCOUNTER — Ambulatory Visit (AMBULATORY_SURGERY_CENTER): Payer: Medicare PPO | Admitting: Gastroenterology

## 2012-07-02 ENCOUNTER — Encounter: Payer: Self-pay | Admitting: Gastroenterology

## 2012-07-02 VITALS — BP 101/57 | HR 89 | Temp 98.2°F | Resp 20 | Ht 68.0 in | Wt 157.0 lb

## 2012-07-02 DIAGNOSIS — D126 Benign neoplasm of colon, unspecified: Secondary | ICD-10-CM

## 2012-07-02 DIAGNOSIS — K5730 Diverticulosis of large intestine without perforation or abscess without bleeding: Secondary | ICD-10-CM

## 2012-07-02 DIAGNOSIS — Z1211 Encounter for screening for malignant neoplasm of colon: Secondary | ICD-10-CM

## 2012-07-02 MED ORDER — SODIUM CHLORIDE 0.9 % IV SOLN: 500.0000 mL | INTRAVENOUS | Status: DC

## 2012-07-02 NOTE — Op Note (Signed)
[facility] [address] [place], [address]   COLONOSCOPY PROCEDURE REPORT  PATIENT: Michael Kramer, Michael Kramer.  MR#: [id number] BIRTHDATE: 02/27/1941 , 70  yrs. old GENDER: Male ENDOSCOPIST: Louis Meckel, MD REFERRED XB:JYNWG Aron, M.D. PROCEDURE DATE:  07/02/2012 PROCEDURE:   Colonoscopy with snare polypectomy ASA CLASS:   Class II INDICATIONS:sigmoid polyp high grade dysplasia one year ago. MEDICATIONS: MAC sedation, administered by CRNA and propofol (Diprivan) 300mg  IV  DESCRIPTION OF PROCEDURE:   After the risks benefits and alternatives of the procedure were thoroughly explained, informed consent was obtained.  A digital rectal exam revealed no abnormalities of the rectum.   The LB CF-[id number]  endoscope was introduced through the anus and advanced to the cecum, which was identified by both the appendix and ileocecal valve. No adverse events experienced.   The quality of the prep was Suprep excellent The instrument was then slowly withdrawn as the colon was fully examined.      COLON FINDINGS: A semi-pedunculated polyp measuring 2 cm in size was found in the ascending colon.  A polypectomy was performed using snare cautery.  The resection was complete and the polyp tissue was completely retrieved.   Moderate diverticulosis was noted in the sigmoid colon.   Mild diverticulosis was noted in the ascending colon.   Internal hemorrhoids were found.   The colon mucosa was otherwise normal.  Retroflexed views revealed no abnormalities. The time to cecum=4 minutes 55 seconds.  Withdrawal time=12 minutes 37 seconds.  The scope was withdrawn and the procedure completed. COMPLICATIONS: There were no complications.  ENDOSCOPIC IMPRESSION: 1.   Semi-pedunculated polyp measuring 2 cm in size was found in the ascending colon; polypectomy was performed using snare cautery 2.   Moderate diverticulosis was noted in the sigmoid colon 3.   Mild diverticulosis was  noted in the ascending colon 4.   Internal hemorrhoids 5.   The colon mucosa was otherwise normal  RECOMMENDATIONS: Colonoscopy 3 years, pending path results   eSigned:  Louis Meckel, MD 07/02/2012 3:35 PM   cc:   PATIENT NAME:  [name]. MR#: [id number]

## 2012-07-02 NOTE — Progress Notes (Signed)
Patient did not experience any of the following events: a burn prior to discharge; a fall within the facility; wrong site/side/patient/procedure/implant event; or a hospital transfer or hospital admission upon discharge from the facility. (G8907) Patient did not have preoperative order for IV antibiotic SSI prophylaxis. (G8918)  

## 2012-07-02 NOTE — Patient Instructions (Addendum)
YOU HAD AN ENDOSCOPIC PROCEDURE TODAY AT THE [facility]: Refer to the procedure report that was given to you for any specific questions about what was found during the examination.  If the procedure report does not answer your questions, please call your gastroenterologist to clarify.  If you requested that your care partner not be given the details of your procedure findings, then the procedure report has been included in a sealed envelope for you to review at your convenience later.  YOU SHOULD EXPECT: Some feelings of bloating in the abdomen. Passage of more gas than usual.  Walking can help get rid of the air that was put into your GI tract during the procedure and reduce the bloating. If you had a lower endoscopy (such as a colonoscopy or flexible sigmoidoscopy) you may notice spotting of blood in your stool or on the toilet paper. If you underwent a bowel prep for your procedure, then you may not have a normal bowel movement for a few days.  DIET: Your first meal following the procedure should be a light meal and then it is ok to progress to your normal diet.  A half-sandwich or bowl of soup is an example of a good first meal.  Heavy or fried foods are harder to digest and may make you feel nauseous or bloated.  Likewise meals heavy in dairy and vegetables can cause extra gas to form and this can also increase the bloating.  Drink plenty of fluids but you should avoid alcoholic beverages for 24 hours.  ACTIVITY: Your care partner should take you home directly after the procedure.  You should plan to take it easy, moving slowly for the rest of the day.  You can resume normal activity the day after the procedure however you should NOT DRIVE or use heavy machinery for 24 hours (because of the sedation medicines used during the test).    SYMPTOMS TO REPORT IMMEDIATELY: A gastroenterologist can be reached at any hour.  During normal business hours, 8:30 AM to 5:00 PM Monday through Friday,  call [phone].  After hours and on weekends, please call the GI answering service at [phone] who will take a message and have the physician on call contact you.   Following lower endoscopy (colonoscopy or flexible sigmoidoscopy):  Excessive amounts of blood in the stool  Significant tenderness or worsening of abdominal pains  Swelling of the abdomen that is new, acute  Fever of 100F or higher  Following upper endoscopy (EGD)  Vomiting of blood or coffee ground material  New chest pain or pain under the shoulder blades  Painful or persistently difficult swallowing  New shortness of breath  Fever of 100F or higher  Black, tarry-looking stools  FOLLOW UP: If any biopsies were taken you will be contacted by phone or by letter within the next 1-3 weeks.  Call your gastroenterologist if you have not heard about the biopsies in 3 weeks.  Our staff will call the home number listed on your records the next business day following your procedure to check on you and address any questions or concerns that you may have at that time regarding the information given to you following your procedure. This is a courtesy call and so if there is no answer at the home number and we have not heard from you through the emergency physician on call, we will assume that you have returned to your regular daily activities without incident.  SIGNATURES/CONFIDENTIALITY: You and/or your care   partner have signed paperwork which will be entered into your electronic medical record.  These signatures attest to the fact that that the information above on your After Visit Summary has been reviewed and is understood.  Full responsibility of the confidentiality of this discharge information lies with you and/or your care-partner.  

## 2012-07-03 ENCOUNTER — Telehealth: Payer: Self-pay | Admitting: *Deleted

## 2012-07-03 NOTE — Telephone Encounter (Signed)
  Follow up Call-  Call back number 07/02/2012 05/29/2011  Post procedure Call Back phone  # [phone]  Permission to leave phone message Yes -     Patient questions:  Do you have a fever, pain , or abdominal swelling? no Pain Score  0 *  Have you tolerated food without any problems? yes  Have you been able to return to your normal activities? yes  Do you have any questions about your discharge instructions: Diet   no Medications  no Follow up visit  no  Do you have questions or concerns about your Care? no  Actions: * If pain score is 4 or above: No action needed, pain <4.

## 2012-07-08 ENCOUNTER — Encounter: Payer: Self-pay | Admitting: Gastroenterology

## 2012-07-17 ENCOUNTER — Ambulatory Visit (INDEPENDENT_AMBULATORY_CARE_PROVIDER_SITE_OTHER): Payer: Medicare PPO | Admitting: Family Medicine

## 2012-07-17 ENCOUNTER — Encounter: Payer: Self-pay | Admitting: Family Medicine

## 2012-07-17 VITALS — BP 150/72 | HR 96 | Temp 97.7°F | Wt 158.0 lb

## 2012-07-17 DIAGNOSIS — Z23 Encounter for immunization: Secondary | ICD-10-CM

## 2012-07-17 DIAGNOSIS — Z136 Encounter for screening for cardiovascular disorders: Secondary | ICD-10-CM

## 2012-07-17 DIAGNOSIS — Z125 Encounter for screening for malignant neoplasm of prostate: Secondary | ICD-10-CM

## 2012-07-17 DIAGNOSIS — I10 Essential (primary) hypertension: Secondary | ICD-10-CM

## 2012-07-17 DIAGNOSIS — Z0000 Encounter for general adult medical examination without abnormal findings: Secondary | ICD-10-CM

## 2012-07-17 MED ORDER — AMLODIPINE-OLMESARTAN 5-40 MG PO TABS: 1.0000 | ORAL_TABLET | Freq: Every day | ORAL | Status: DC

## 2012-07-17 MED ORDER — LANSOPRAZOLE 15 MG PO CPDR: 15.0000 mg | DELAYED_RELEASE_CAPSULE | Freq: Every day | ORAL | Status: DC

## 2012-07-17 MED ORDER — AMLODIPINE BESYLATE 5 MG PO TABS: 5.0000 mg | ORAL_TABLET | Freq: Every day | ORAL | Status: DC

## 2012-07-17 MED ORDER — ZOSTER VACCINE LIVE 19400 UNT/0.65ML ~~LOC~~ SOLR: 0.6500 mL | Freq: Once | SUBCUTANEOUS | Status: DC

## 2012-07-17 NOTE — Patient Instructions (Addendum)
Please come in at your convenience - for fasting labs.

## 2012-07-17 NOTE — Progress Notes (Signed)
71 yo here for follow up blood pressure. Last office visit with me was 07/06/2011.   Colonoscopy (Dr. Arlyce Dice) two weeks ago- recall in 3 years due to polyps.  Doing well, excited to see his family for Thanksgiving.    HTN- on Azor 5-40 mg and Norvasc 5 mg daily. No CP, SOB, blurred vision or LE edema.  BP elevated today but at colonoscopy 2 weeks ago (VS in Epic)- BP was 101-116/64-67.  H/o bladder CA- seeing urology yearly now instead of twice a year. Sees Dr. Lonna Cobb.   No hematuria, urinary frequency or dysuria.     Patient Active Problem List  Diagnosis  . BLADDER CANCER  . PURE HYPERCHOLESTEROLEMIA  . ANXIETY  . MIGRAINE WITH AURA  . HYPERTENSION, BENIGN ESSENTIAL  . ACUTE SINUSITIS, UNSPECIFIED  . Allergic Rhinitis, Cause Unspecified  . PNEUMONIA  . GERD  . BENIGN PROSTATIC HYPERTROPHY  . OSTEOARTHRITIS  . GLUCOSE INTOLERANCE, HX OF  . Screening for colon cancer  . GERD (gastroesophageal reflux disease)  . Rectal bleeding  . Bowel habit changes   Past Medical History  Diagnosis Date  . Allergy   . GERD (gastroesophageal reflux disease)   . Hypertension   . Arthritis   . Cancer     bladder  . BPH (benign prostatic hypertrophy)   . Shingles   . Anxiety     white coat syndrome   Past Surgical History  Procedure Date  . Cholecystectomy 1982  . Tonsillectomy and adenoidectomy 1948  . Doppler echocardiography 1997    normal  . Electrocardiogram   . Holter moniter     normal  . Cartoid     normal  . Turbt   . Bladder cancer 10/2006    treated with TB Virus   History  Substance Use Topics  . Smoking status: Former Smoker    Types: Cigarettes    Quit date: 01/20/1987  . Smokeless tobacco: Never Used  . Alcohol Use: No   Family History  Problem Relation Age of Onset  . Emphysema Mother     smoker  . Hypertension Paternal Grandfather    No Known Allergies Current Outpatient Prescriptions on File Prior to Visit  Medication Sig Dispense Refill    . acetaminophen (TYLENOL) 500 MG tablet Take 500 mg by mouth every 6 (six) hours as needed. pain      . amLODipine (NORVASC) 5 MG tablet Take 1 tablet (5 mg total) by mouth daily.  90 tablet  3  . amLODipine-olmesartan (AZOR) 5-40 MG per tablet Take 1 tablet by mouth daily.  90 tablet  3  . fish oil-omega-3 fatty acids 1000 MG capsule Take 1,200 mg by mouth daily.      . lansoprazole (PREVACID) 15 MG capsule Take 1 capsule (15 mg total) by mouth daily.  90 capsule  3  . Multiple Vitamins-Minerals (MULTIVITAMIN WITH MINERALS) tablet Take 1 tablet by mouth daily.       The PMH, PSH, Social History, Family History, Medications, and allergies have been reviewed in [facility], and have been updated if relevant.  Review of Systems  See HPI  General: Denies malaise.  Eyes: Denies blurring.  ENT: Denies difficulty swallowing.  CV: Denies chest pain or discomfort.  Resp: Denies shortness of breath.  GI: Denies abdominal pain, bloody stools, and change in bowel habits.  GU: Denies discharge and dysuria.  Derm: Denies rash.  Psych: Denies anxiety and depression.  Physical Exam  BP 150/72  Pulse 96  Temp  97.7 F (36.5 C)  Wt 158 lb (71.668 kg)  General: alert, well-developed, well-nourished, and well-hydrated. thin, NAD  Head: normocephalic and atraumatic.  Eyes: vision grossly intact, pupils equal, pupils round, and pupils reactive to light.  Ears: R ear normal and L ear normal.  Nose: no external deformity.  Mouth: good dentition.  Lungs: Normal respiratory effort, chest expands symmetrically. Lungs are clear to auscultation, no crackles or wheezes.  Heart: Normal rate and regular rhythm. S1 and S2 normal without gallop, murmur, click, rub or other extra sounds.  Abdomen: Bowel sounds positive,abdomen soft and non-tender without masses, organomegaly or hernias noted.  Msk: No deformity or scoliosis noted of thoracic or lumbar spine.  Extremities: No clubbing, cyanosis, edema, or deformity noted  with normal full range of motion of all joints.  Neurologic: alert & oriented X3 and gait normal.  Skin: Intact without suspicious lesions or rashes  Psych: Cognition and judgment appear intact. Alert and cooperative with normal attention span and concentration. No apparent delusions, illusions, hallucinations   Assessment and Plan:  1. HYPERTENSION, BENIGN ESSENTIAL  Stable, does have a component of white coat HTN.  Continue current meds.

## 2012-07-17 NOTE — Addendum Note (Signed)
Addended by: Eliezer Bottom on: 07/17/2012 12:10 PM   Modules accepted: Orders

## 2012-07-22 ENCOUNTER — Telehealth: Payer: Self-pay | Admitting: *Deleted

## 2012-07-22 NOTE — Telephone Encounter (Signed)
Form from [facility] is on your desk.  They are asking for clarification of azor and amlodipine scripts sent in on 07/17/12.  They are saying that this is duplicate therapy.  Please advise.

## 2012-07-23 NOTE — Telephone Encounter (Signed)
Form completed, faxed back to [facility].

## 2012-08-06 ENCOUNTER — Other Ambulatory Visit (INDEPENDENT_AMBULATORY_CARE_PROVIDER_SITE_OTHER): Payer: Medicare PPO

## 2012-08-06 ENCOUNTER — Ambulatory Visit (INDEPENDENT_AMBULATORY_CARE_PROVIDER_SITE_OTHER): Payer: Medicare PPO | Admitting: Family Medicine

## 2012-08-06 ENCOUNTER — Encounter: Payer: Self-pay | Admitting: Family Medicine

## 2012-08-06 VITALS — BP 144/72 | HR 84 | Temp 98.2°F | Wt 159.0 lb

## 2012-08-06 DIAGNOSIS — I10 Essential (primary) hypertension: Secondary | ICD-10-CM

## 2012-08-06 DIAGNOSIS — R21 Rash and other nonspecific skin eruption: Secondary | ICD-10-CM

## 2012-08-06 DIAGNOSIS — Z125 Encounter for screening for malignant neoplasm of prostate: Secondary | ICD-10-CM

## 2012-08-06 DIAGNOSIS — Z136 Encounter for screening for cardiovascular disorders: Secondary | ICD-10-CM

## 2012-08-06 LAB — LIPID PANEL
HDL: 46.5 mg/dL (ref >39.00)
VLDL: 46 mg/dL — ABNORMAL HIGH (ref 0.0–40.0)

## 2012-08-06 LAB — COMPREHENSIVE METABOLIC PANEL
ALT: 28 U/L (ref 0–53)
AST: 27 U/L (ref 0–37)
Creatinine, Ser: 0.9 mg/dL (ref 0.4–1.5)
GFR: 91.4 mL/min (ref >60.00)
Total Bilirubin: 1 mg/dL (ref 0.3–1.2)

## 2012-08-06 MED ORDER — VALACYCLOVIR HCL 1 G PO TABS: 1000.0000 mg | ORAL_TABLET | Freq: Three times a day (TID) | ORAL | Status: DC

## 2012-08-06 NOTE — Assessment & Plan Note (Signed)
Exam and sx not c/w shingles.  Very unlikely to be shingles.  Would hold rx in case, but treat as dry skin in meantime.  See instructions.

## 2012-08-06 NOTE — Progress Notes (Signed)
H/o zoster 2 months ago on L upper chest, s/p treatment.  Yesterday he had itching on his back.  Thought he saw a rash yesterday.  Leaving town on Thursday.  It's itchy but not painful.  Had not gotten the shingles vaccine yet.  Other than the itching on the back, he feels well.  Prev zoster was painful and this isn't similar in that regard.    Meds, vitals, and allergies reviewed.   ROS: See HPI.  Otherwise, noncontributory.  nad Skin w/o acute rash.  This is a patch of dry and mildly irritated skin on L lower back but no dermatomal rash, no dermatomal change in sensation.

## 2012-08-06 NOTE — Patient Instructions (Signed)
This doesn't look like shingles.  I would treat it as dry skin- over the counter lotion or hydrocortisone for the itching.  Hold onto the valtrex rx in the meantime and start if you have burning pain.  Take care.

## 2012-08-07 ENCOUNTER — Encounter: Payer: Self-pay | Admitting: *Deleted

## 2013-01-09 ENCOUNTER — Other Ambulatory Visit: Payer: Self-pay

## 2013-01-09 MED ORDER — AMLODIPINE-OLMESARTAN 5-40 MG PO TABS: 1.0000 | ORAL_TABLET | Freq: Every day | ORAL | Status: DC

## 2013-01-09 MED ORDER — AMLODIPINE BESYLATE 5 MG PO TABS: 5.0000 mg | ORAL_TABLET | Freq: Every day | ORAL | Status: DC

## 2013-01-09 NOTE — Telephone Encounter (Signed)
Pt's wife request refill amlodipine and azor to [facility].advised done.

## 2013-04-03 ENCOUNTER — Telehealth: Payer: Self-pay

## 2013-04-03 NOTE — Telephone Encounter (Signed)
Pt request refill Azor to [facility]. Pt will call [facility] refills should be available.

## 2013-06-30 ENCOUNTER — Other Ambulatory Visit: Payer: Self-pay

## 2013-06-30 MED ORDER — LANSOPRAZOLE 15 MG PO CPDR: 15.0000 mg | DELAYED_RELEASE_CAPSULE | Freq: Every day | ORAL | Status: DC

## 2013-06-30 NOTE — Telephone Encounter (Signed)
Pt left v/m requesting refill prevacid to [facility]; pt has been out of med 2 weeks and last seen 07/17/12 for f/u .Please advise.

## 2013-07-01 NOTE — Telephone Encounter (Signed)
Pt notified by telephone v/m rx sent.

## 2013-09-04 ENCOUNTER — Encounter: Payer: Self-pay | Admitting: Internal Medicine

## 2013-09-04 ENCOUNTER — Ambulatory Visit (INDEPENDENT_AMBULATORY_CARE_PROVIDER_SITE_OTHER): Payer: Medicare PPO | Admitting: Internal Medicine

## 2013-09-04 VITALS — BP 120/78 | HR 91 | Temp 98.5°F | Wt 154.8 lb

## 2013-09-04 DIAGNOSIS — J069 Acute upper respiratory infection, unspecified: Secondary | ICD-10-CM

## 2013-09-04 MED ORDER — HYDROCODONE-HOMATROPINE 5-1.5 MG/5ML PO SYRP: 5.0000 mL | ORAL_SOLUTION | Freq: Three times a day (TID) | ORAL | Status: DC | PRN

## 2013-09-04 NOTE — Progress Notes (Signed)
HPI  Pt presents to the clinic today with c/o cough, chest congestion and chills. This started 1 day ago. The cough is productive of thick white sputum.He denies fevers, but has had body aches. He has not taken anything OTC.  He has had sick contacts. He does have a history of allergies but denies breathing problems.  Review of Systems      Past Medical History  Diagnosis Date  . Allergy   . GERD (gastroesophageal reflux disease)   . Hypertension   . Arthritis   . Cancer     bladder  . BPH (benign prostatic hypertrophy)   . Shingles   . Anxiety     white coat syndrome    Family History  Problem Relation Age of Onset  . Emphysema Mother     smoker  . Hypertension Paternal Grandfather     History   Social History  . Marital Status: Married    Spouse Name: N/A    Number of Children: 4  . 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 Smoking status: Former Smoker    Types: Cigarettes    Quit date: 01/20/1987  . Smokeless tobacco: Never Used  . Alcohol Use: No  . Drug Use: No  . Sexual Activity: Not on file   Other Topics Concern  . Not on file   Social History Narrative   Hobbies: golf and sports   Regular exercise--yes golf 3 times a week   Sleeps 8-9 hours per night          No Known Allergies   Constitutional:  Denies headache, fatigue, fevers or abrupt weight changes.  HEENT:  Positive sore throat. Denies eye redness, eye pain, pressure behind the eyes, facial pain, nasal congestion, ear pain, ringing in the ears, wax buildup, runny nose or bloody nose. Respiratory: Positive cough. Denies difficulty breathing or shortness of breath.  Cardiovascular: Denies chest pain, chest tightness, palpitations or swelling in the hands or feet.   No other specific complaints in a complete review of systems (except as listed in HPI above).  Objective:   BP 120/78  Pulse 91  Temp(Src) 98.5 F (36.9  C) (Oral)  Wt 154 lb 12.8 oz (70.217 kg)  SpO2 96% Wt Readings from Last 3 Encounters:  09/04/13 154 lb 12.8 oz (70.217 kg)  08/06/12 159 lb (72.122 kg)  07/17/12 158 lb (71.668 kg)     General: Appears his stated age, well developed, well nourished in NAD. HEENT: Head: normal shape and size; Eyes: sclera white, no icterus, conjunctiva pink, PERRLA and EOMs intact; Ears: Tm's gray and intact, normal light reflex; Nose: mucosa pink and moist, septum midline; Throat/Mouth: + PND. Teeth present, mucosa erythematous and moist, no exudate noted, no lesions or ulcerations noted.  Neck: Neck supple, trachea midline. No massses, lumps or thyromegaly present.  Cardiovascular: Normal rate and rhythm. S1,S2 noted.  No murmur, rubs or gallops noted. No JVD or BLE edema. No carotid bruits noted. Pulmonary/Chest: Normal effort and positive vesicular breath sounds. No respiratory distress. No wheezes, rales or ronchi noted.      Assessment & Plan:   Upper Respiratory Infection, likely viral at this time:  Get some rest and drink plenty of water Do salt water gargles for the sore throat Try Mucinex OTC eRx for Hycodan cough syrup  RTC as needed or if symptoms persist.

## 2013-09-04 NOTE — Patient Instructions (Signed)

## 2013-09-04 NOTE — Progress Notes (Signed)
Pre-visit discussion using our clinic review tool. No additional management support is needed unless otherwise documented below in the visit note.  

## 2013-09-29 ENCOUNTER — Telehealth: Payer: Self-pay

## 2013-09-29 MED ORDER — AMLODIPINE-OLMESARTAN 5-40 MG PO TABS: 1.0000 | ORAL_TABLET | Freq: Every day | ORAL | Status: DC

## 2013-09-29 MED ORDER — AMLODIPINE BESYLATE 5 MG PO TABS: 5.0000 mg | ORAL_TABLET | Freq: Every day | ORAL | Status: DC

## 2013-09-29 NOTE — Telephone Encounter (Signed)
Pt request refill to [facility] for amlodiopine and Azor. Pt will call for appt with Dr Deborra Medina in next 2 weeks. Advised pt refill done.

## 2013-10-13 NOTE — Telephone Encounter (Signed)
Pt spoke with [facility] and they need more information about amlodipine and Azor. 10/01/13 [facility] faxed forms to out office about possible duplication of the two meds.Please advise.

## 2013-10-13 NOTE — Telephone Encounter (Signed)
Pt has been prescribed both azor and norvasc; pls advise

## 2013-10-14 ENCOUNTER — Ambulatory Visit: Payer: Medicare PPO | Admitting: Family Medicine

## 2013-10-14 MED ORDER — AMLODIPINE-OLMESARTAN 5-40 MG PO TABS: 1.0000 | ORAL_TABLET | Freq: Every day | ORAL | Status: DC

## 2013-10-14 MED ORDER — AMLODIPINE BESYLATE 5 MG PO TABS: 5.0000 mg | ORAL_TABLET | Freq: Every day | ORAL | Status: DC

## 2013-10-14 NOTE — Telephone Encounter (Signed)
Tammy at [facility] notified as instructed.

## 2013-10-14 NOTE — Addendum Note (Signed)
Addended by: Helene Shoe on: 10/14/2013 09:44 AM   Modules accepted: Orders

## 2013-10-14 NOTE — Telephone Encounter (Signed)
Yes I am prescribing both- last time we saw him he needed amlodipine 10 mg to control his BP which is what dispensing both would be.

## 2013-10-14 NOTE — Telephone Encounter (Signed)
Yes he has been taking both.  Please verify this with pt.

## 2013-10-14 NOTE — Telephone Encounter (Signed)
Tammy with [facility] said Dr Hulen Shouts answer this morning that pt has been taking both is not sufficient for her to dispense. Tammy wants verification from Dr Deborra Medina that she prescribed amlodipine 5 mg taking one daily and Azor 5-40 mg taking one daily before Tammy will dispense med.Please advise.

## 2013-10-14 NOTE — Telephone Encounter (Signed)
Mrs Kunert said pt is taking amlodipine and Azor; Mrs Gudino will have [facility] refax form to [phone] and pt is scheduled BP ck and med refill visit on 10/20/13; Mrs Bonet ask if Dr Deborra Medina will remind pt to schedule CPX. Pt is almost out of meds and request # 30 amlodipine and azor to [facility]. Advised Mrs Gilkison refill done.

## 2013-10-20 ENCOUNTER — Ambulatory Visit (INDEPENDENT_AMBULATORY_CARE_PROVIDER_SITE_OTHER): Payer: Medicare PPO | Admitting: Family Medicine

## 2013-10-20 ENCOUNTER — Encounter: Payer: Self-pay | Admitting: Family Medicine

## 2013-10-20 VITALS — BP 136/78 | HR 85 | Temp 97.5°F | Wt 156.5 lb

## 2013-10-20 DIAGNOSIS — I10 Essential (primary) hypertension: Secondary | ICD-10-CM

## 2013-10-20 DIAGNOSIS — Z0000 Encounter for general adult medical examination without abnormal findings: Secondary | ICD-10-CM

## 2013-10-20 DIAGNOSIS — C679 Malignant neoplasm of bladder, unspecified: Secondary | ICD-10-CM

## 2013-10-20 DIAGNOSIS — Z136 Encounter for screening for cardiovascular disorders: Secondary | ICD-10-CM

## 2013-10-20 DIAGNOSIS — K219 Gastro-esophageal reflux disease without esophagitis: Secondary | ICD-10-CM

## 2013-10-20 MED ORDER — LANSOPRAZOLE 15 MG PO CPDR: 15.0000 mg | DELAYED_RELEASE_CAPSULE | Freq: Every day | ORAL | Status: DC

## 2013-10-20 MED ORDER — AMLODIPINE-OLMESARTAN 5-40 MG PO TABS: 1.0000 | ORAL_TABLET | Freq: Every day | ORAL | Status: DC

## 2013-10-20 MED ORDER — AMLODIPINE BESYLATE 5 MG PO TABS: 5.0000 mg | ORAL_TABLET | Freq: Every day | ORAL | Status: DC

## 2013-10-20 NOTE — Assessment & Plan Note (Signed)
Quiet on prevacid. eRx sent to mail order pharmacy.

## 2013-10-20 NOTE — Progress Notes (Signed)
Very pleasant 73 yo here for follow up blood pressure.    Very healthy and quite active.  Golfs regularly.  Colonoscopy UTD (in Epic).  HTN- on Azor 5-40 mg and Norvasc 5 mg daily. No CP, SOB, blurred vision or LE edema.   Lab Results  Component Value Date   CREATININE 0.9 08/06/2012    H/o bladder CA (7 years ago)- seeing urology yearly now instead of twice a year. Sees Dr. Bernardo Heater.  Last saw him in June 2014.   No hematuria, urinary frequency or dysuria.    Lab Results  Component Value Date   CHOL 193 08/06/2012   HDL 46.50 08/06/2012   LDLCALC 84 11/09/2011   LDLDIRECT 113.3 08/06/2012   TRIG 230.0* 08/06/2012   CHOLHDL 4 08/06/2012    Patient Active Problem List   Diagnosis Date Noted  . Screening for colon cancer 05/25/2011  . GERD (gastroesophageal reflux disease) 05/25/2011  . PURE HYPERCHOLESTEROLEMIA 05/20/2009  . BLADDER CANCER 02/13/2007  . ANXIETY 02/13/2007  . MIGRAINE WITH AURA 02/13/2007  . HYPERTENSION, BENIGN ESSENTIAL 02/13/2007  . Allergic Rhinitis, Cause Unspecified 02/13/2007  . BENIGN PROSTATIC HYPERTROPHY 02/13/2007  . OSTEOARTHRITIS 02/13/2007  . GLUCOSE INTOLERANCE, HX OF 02/13/2007   Past Medical History  Diagnosis Date  . Allergy   . GERD (gastroesophageal reflux disease)   . Hypertension   . Arthritis   . Cancer     bladder  . BPH (benign prostatic hypertrophy)   . Shingles   . Anxiety     white coat syndrome   Past Surgical History  Procedure Laterality Date  . Cholecystectomy  1982  . Tonsillectomy and adenoidectomy  1948  . Doppler echocardiography  1997    normal  . Electrocardiogram    . Holter moniter      normal  . Cartoid      normal  . Turbt    . 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 Smoking status: Former Smoker    Types: Cigarettes    Quit date: 01/20/1987  . Smokeless tobacco: Never Used  . Alcohol Use: No   Family History  Problem Relation Age of Onset   . Emphysema Mother     smoker  . Hypertension Paternal Grandfather    No Known Allergies Current Outpatient Prescriptions on File Prior to Visit  Medication Sig Dispense Refill  . acetaminophen (TYLENOL) 500 MG tablet Take 500 mg by mouth every 6 (six) hours as needed. pain      . amLODipine (NORVASC) 5 MG tablet Take 1 tablet (5 mg total) by mouth daily.  30 tablet  0  . amLODipine-olmesartan (AZOR) 5-40 MG per tablet Take 1 tablet by mouth daily.  30 tablet  0  . fish oil-omega-3 fatty acids 1000 MG capsule Take 1,200 mg by mouth daily.      Marland Kitchen HYDROcodone-homatropine (HYCODAN) 5-1.5 MG/5ML syrup Take 5 mLs by mouth every 8 (eight) hours as needed for cough.  120 mL  0  . lansoprazole (PREVACID) 15 MG capsule Take 1 capsule (15 mg total) by mouth daily.  90 capsule  1  . Multiple Vitamins-Minerals (MULTIVITAMIN WITH MINERALS) tablet Take 1 tablet by mouth daily.       No current facility-administered medications on file prior to visit.   The PMH, PSH, Social History, Family History, Medications, and allergies have been reviewed in [facility], and have been updated if relevant.  Review of  Systems  See HPI  General: Denies malaise.  Eyes: Denies blurring.  ENT: Denies difficulty swallowing.  CV: Denies chest pain or discomfort.  Resp: Denies shortness of breath.  GI: Denies abdominal pain, bloody stools, and change in bowel habits.  GU: Denies discharge and dysuria.  Derm: Denies rash.  Psych: Denies anxiety and depression.  Physical Exam  BP 136/78  Pulse 85  Temp(Src) 97.5 F (36.4 C) (Oral)  Wt 156 lb 8 oz (70.988 kg)  SpO2 95%  General: alert, well-developed, well-nourished, and well-hydrated. thin, NAD  Head: normocephalic and atraumatic.  Eyes: vision grossly intact, pupils equal, pupils round, and pupils reactive to light.  Ears: R ear normal and L ear normal.  Nose: no external deformity.  Mouth: good dentition.  Lungs: Normal respiratory effort, chest expands  symmetrically. Lungs are clear to auscultation, no crackles or wheezes.  Heart: Normal rate and regular rhythm. S1 and S2 normal without gallop, murmur, click, rub or other extra sounds.  Abdomen: Bowel sounds positive,abdomen soft and non-tender without masses, organomegaly or hernias noted.  Msk: No deformity or scoliosis noted of thoracic or lumbar spine.  Extremities: No clubbing, cyanosis, edema, or deformity noted with normal full range of motion of all joints.  Neurologic: alert & oriented X3 and gait normal.  Skin: Intact without suspicious lesions or rashes  Psych: Cognition and judgment appear intact. Alert and cooperative with normal attention span and concentration. No apparent delusions, illusions, hallucinations   Assessment and Plan:

## 2013-10-20 NOTE — Assessment & Plan Note (Signed)
Well controlled. No changes.  Due for labs- future orders entered as he would like to do them another day.

## 2013-10-20 NOTE — Progress Notes (Signed)
Pre visit review using our clinic review tool, if applicable. No additional management support is needed unless otherwise documented below in the visit note. 

## 2013-10-20 NOTE — Assessment & Plan Note (Signed)
Followed yearly by urology. No recurrence.

## 2013-10-21 ENCOUNTER — Telehealth: Payer: Self-pay | Admitting: Family Medicine

## 2013-10-21 NOTE — Telephone Encounter (Signed)
Relevant patient education assigned to patient using Emmi. ° °

## 2013-11-11 ENCOUNTER — Other Ambulatory Visit (INDEPENDENT_AMBULATORY_CARE_PROVIDER_SITE_OTHER): Payer: Medicare PPO

## 2013-11-11 DIAGNOSIS — I10 Essential (primary) hypertension: Secondary | ICD-10-CM

## 2013-11-11 DIAGNOSIS — Z136 Encounter for screening for cardiovascular disorders: Secondary | ICD-10-CM

## 2013-11-11 DIAGNOSIS — Z125 Encounter for screening for malignant neoplasm of prostate: Secondary | ICD-10-CM

## 2013-11-11 LAB — LIPID PANEL
CHOLESTEROL: 172 mg/dL (ref 0–200)
HDL: 48.3 mg/dL (ref >39.00)
LDL CALC: 89 mg/dL (ref 0–99)
Total CHOL/HDL Ratio: 4
Triglycerides: 172 mg/dL — ABNORMAL HIGH (ref 0.0–149.0)
VLDL: 34.4 mg/dL (ref 0.0–40.0)

## 2013-11-11 LAB — COMPREHENSIVE METABOLIC PANEL
ALK PHOS: 80 U/L (ref 39–117)
ALT: 29 U/L (ref 0–53)
AST: 28 U/L (ref 0–37)
Albumin: 4.4 g/dL (ref 3.5–5.2)
BUN: 15 mg/dL (ref 6–23)
CO2: 32 mEq/L (ref 19–32)
CREATININE: 0.9 mg/dL (ref 0.4–1.5)
Calcium: 10.3 mg/dL (ref 8.4–10.5)
Chloride: 101 mEq/L (ref 96–112)
GFR: 88.73 mL/min (ref >60.00)
Glucose, Bld: 100 mg/dL — ABNORMAL HIGH (ref 70–99)
Potassium: 4.1 mEq/L (ref 3.5–5.1)
Sodium: 139 mEq/L (ref 135–145)
Total Bilirubin: 0.9 mg/dL (ref 0.3–1.2)
Total Protein: 7.2 g/dL (ref 6.0–8.3)

## 2013-11-11 LAB — PSA, MEDICARE: PSA: 0.81 ng/ml (ref 0.10–4.00)

## 2013-11-12 ENCOUNTER — Encounter: Payer: Self-pay | Admitting: *Deleted

## 2014-02-06 ENCOUNTER — Telehealth: Payer: Self-pay | Admitting: *Deleted

## 2014-02-06 ENCOUNTER — Other Ambulatory Visit: Payer: Self-pay | Admitting: Family Medicine

## 2014-02-06 ENCOUNTER — Other Ambulatory Visit: Payer: Self-pay | Admitting: *Deleted

## 2014-02-06 NOTE — Telephone Encounter (Signed)
Appears on last OV that he is taking both.

## 2014-02-06 NOTE — Telephone Encounter (Signed)
If he is interested it appears there is a  azor 40/10 mg dose that he could use ( instead of having to combine azor and amlodipine. Please call pt to offer.

## 2014-02-06 NOTE — Telephone Encounter (Signed)
[facility] needed clarification for the Azor and amlodipine. Is he taking both or one or the other? Please clarify.

## 2014-02-09 NOTE — Telephone Encounter (Signed)
Lm on pts vm requesting a call back 

## 2014-02-11 ENCOUNTER — Ambulatory Visit: Payer: Medicare PPO | Admitting: Family Medicine

## 2014-02-13 NOTE — Telephone Encounter (Signed)
Information faxed to [facility] regarding refills

## 2014-02-13 NOTE — Telephone Encounter (Signed)
Pt left v/m requesting cb about Azor and amlodipine refills.Please advise.

## 2014-02-16 ENCOUNTER — Other Ambulatory Visit: Payer: Self-pay

## 2014-02-16 MED ORDER — AMLODIPINE-OLMESARTAN 5-40 MG PO TABS: 1.0000 | ORAL_TABLET | Freq: Every day | ORAL | Status: DC

## 2014-02-16 NOTE — Telephone Encounter (Signed)
Yes.  I am aware.

## 2014-02-16 NOTE — Telephone Encounter (Signed)
Spoke to [place] at [facility] who states that there was no problem with the pts med. The Azor was on back order and is the reason why he had not received it as of yet. It is now being dispensed and pt advised to await its arrival. Original Rx for amlodipine was shipped on 01/20/14 and pt had received. Pt had recently asked that short term supply of med be sent to local pharmacy. Pt informed me that he has spoken with [facility] who confirmed shipment, and he has [facility]

## 2014-02-16 NOTE — Addendum Note (Signed)
Addended by: Helene Shoe on: 02/16/2014 12:56 PM   Modules accepted: Orders

## 2014-02-16 NOTE — Telephone Encounter (Signed)
Mrs Moffatt left v/m; [employer] mail order will not refill Azor until gets call from PCP that Dr Deborra Medina is aware that pt is taking Azor 5-40 mg and Amlodipine 5 mg. [employer] can be reached at [phone]. Mrs Frediani also request refill of Azor to [facility] for smaller quantity until pt can get mail order pharmacy. Mrs Stradling request cb.

## 2014-10-08 ENCOUNTER — Ambulatory Visit: Payer: Medicare PPO | Admitting: Family Medicine

## 2014-10-14 ENCOUNTER — Encounter: Payer: Self-pay | Admitting: Family Medicine

## 2014-10-14 ENCOUNTER — Ambulatory Visit (INDEPENDENT_AMBULATORY_CARE_PROVIDER_SITE_OTHER): Payer: Medicare PPO | Admitting: Family Medicine

## 2014-10-14 ENCOUNTER — Other Ambulatory Visit: Payer: Self-pay | Admitting: Family Medicine

## 2014-10-14 VITALS — BP 126/64 | HR 77 | Temp 97.9°F | Wt 160.0 lb

## 2014-10-14 DIAGNOSIS — M25522 Pain in left elbow: Secondary | ICD-10-CM

## 2014-10-14 DIAGNOSIS — M720 Palmar fascial fibromatosis [Dupuytren]: Secondary | ICD-10-CM

## 2014-10-14 DIAGNOSIS — Z0000 Encounter for general adult medical examination without abnormal findings: Secondary | ICD-10-CM

## 2014-10-14 MED ORDER — AMLODIPINE BESYLATE 5 MG PO TABS: 5.0000 mg | ORAL_TABLET | Freq: Every day | ORAL | Status: DC

## 2014-10-14 MED ORDER — MELOXICAM 7.5 MG PO TABS: 7.5000 mg | ORAL_TABLET | Freq: Every day | ORAL | Status: DC

## 2014-10-14 MED ORDER — LANSOPRAZOLE 15 MG PO CPDR: 15.0000 mg | DELAYED_RELEASE_CAPSULE | Freq: Every day | ORAL | Status: DC

## 2014-10-14 MED ORDER — AMLODIPINE-OLMESARTAN 5-40 MG PO TABS: 1.0000 | ORAL_TABLET | Freq: Every day | ORAL | Status: DC

## 2014-10-14 NOTE — Progress Notes (Signed)
Subjective:   Patient ID: Michael Kramer, male    DOB: Jan 26, 1941, 74 y.o.   MRN: [id number]  Michael Kramer is a pleasant 74 y.o. year old male who presents to clinic today with Arm Pain  on 10/14/2014  HPI: Elbow Pain Patient complains of left elbow pain. Onset of the symptoms was several months ago. Inciting event: increased use plays golf. Current symptoms include: painly with supination. Pain is aggravated by: supination/pronation as when opening doors. Symptoms have been intermittent. Patient has had prior elbow problems- on right side. Evaluation to date: none. Treatment to date: nothing specific.  Current Outpatient Prescriptions on File Prior to Visit  Medication Sig Dispense Refill  . acetaminophen (TYLENOL) 500 MG tablet Take 500 mg by mouth every 6 (six) hours as needed. pain    . fish oil-omega-3 fatty acids 1000 MG capsule Take 1,200 mg by mouth daily.    . Multiple Vitamins-Minerals (MULTIVITAMIN WITH MINERALS) tablet Take 1 tablet by mouth daily.     No current facility-administered medications on file prior to visit.    No Known Allergies  Past Medical History  Diagnosis Date  . Allergy   . GERD (gastroesophageal reflux disease)   . Hypertension   . Arthritis   . Cancer     bladder  . BPH (benign prostatic hypertrophy)   . Shingles   . Anxiety     white coat syndrome    Past Surgical History  Procedure Laterality Date  . Cholecystectomy  1982  . Tonsillectomy and adenoidectomy  1948  . Doppler echocardiography  1997    normal  . Electrocardiogram    . Holter moniter      normal  . Cartoid      normal  . Turbt    . Bladder cancer  10/2006    treated with TB Virus    Family History  Problem Relation Age of Onset  . Emphysema Mother     smoker  . Hypertension Paternal Grandfather     History   Social History  . Marital Status: Married    Spouse Name: N/A  . Number of Children: 4  . 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 Smoking status: Former Smoker    Types: Cigarettes    Quit date: 01/20/1987  . Smokeless tobacco: Never Used  . Alcohol Use: No  . Drug Use: No  . Sexual Activity: Not on file   Other Topics Concern  . Not on file   Social History Narrative   Hobbies: golf and sports   Regular exercise--yes golf 3 times a week   Sleeps 8-9 hours per night         The PMH, PSH, Social History, Family History, Medications, and allergies have been reviewed in [facility], and have been updated if relevant.   Review of Systems  Constitutional: Negative.   Musculoskeletal: Positive for arthralgias. Negative for joint swelling.  Neurological: Negative.   All other systems reviewed and are negative.      Objective:    BP 126/64 mmHg  Pulse 77  Temp(Src) 97.9 F (36.6 C) (Oral)  Wt 160 lb (72.576 kg)  SpO2 95%   Physical Exam     BP 126/64 mmHg  Pulse 77  Temp(Src) 97.9 F (36.6 C) (Oral)  Wt 160 lb (72.576 kg)  SpO2 95% Right elbow: without deformity and full active ROM  Left elbow:  swelling present, passive ROM 45 degrees pronation and tenderness over medial epicondyle     Assessment:    left medial epicondylitis    Plan:    Natural history and expected course discussed. Questions answered. Rest, ice, compression, and elevation (RICE) therapy. Reduction in offending activity. Tennis elbow splint. NSAIDs per medication orders. exercises given from sports med advisor

## 2014-10-14 NOTE — Patient Instructions (Signed)
Good to see you. Try exercises and take mobic as directed with food- please call me with an update in a few weeks.

## 2014-10-14 NOTE — Progress Notes (Signed)
Pre visit review using our clinic review tool, if applicable. No additional management support is needed unless otherwise documented below in the visit note. 

## 2014-10-23 ENCOUNTER — Other Ambulatory Visit (INDEPENDENT_AMBULATORY_CARE_PROVIDER_SITE_OTHER): Payer: Medicare PPO

## 2014-10-23 DIAGNOSIS — I10 Essential (primary) hypertension: Secondary | ICD-10-CM | POA: Diagnosis not present

## 2014-10-23 DIAGNOSIS — E780 Pure hypercholesterolemia: Secondary | ICD-10-CM | POA: Diagnosis not present

## 2014-10-23 DIAGNOSIS — Z0000 Encounter for general adult medical examination without abnormal findings: Secondary | ICD-10-CM | POA: Diagnosis not present

## 2014-10-23 DIAGNOSIS — N400 Enlarged prostate without lower urinary tract symptoms: Secondary | ICD-10-CM

## 2014-10-23 LAB — CBC WITH DIFFERENTIAL/PLATELET
Basophils Absolute: 0.1 10*3/uL (ref 0.0–0.1)
Basophils Relative: 0.5 % (ref 0.0–3.0)
EOS PCT: 2.8 % (ref 0.0–5.0)
Eosinophils Absolute: 0.3 10*3/uL (ref 0.0–0.7)
HCT: 46.9 % (ref 39.0–52.0)
Hemoglobin: 16.1 g/dL (ref 13.0–17.0)
LYMPHS ABS: 2.9 10*3/uL (ref 0.7–4.0)
Lymphocytes Relative: 30.2 % (ref 12.0–46.0)
MCHC: 34.3 g/dL (ref 30.0–36.0)
MCV: 89.8 fl (ref 78.0–100.0)
Monocytes Absolute: 0.8 10*3/uL (ref 0.1–1.0)
Monocytes Relative: 7.9 % (ref 3.0–12.0)
Neutro Abs: 5.7 10*3/uL (ref 1.4–7.7)
Neutrophils Relative %: 58.6 % (ref 43.0–77.0)
Platelets: 178 10*3/uL (ref 150.0–400.0)
RBC: 5.22 Mil/uL (ref 4.22–5.81)
RDW: 13.4 % (ref 11.5–15.5)
WBC: 9.7 10*3/uL (ref 4.0–10.5)

## 2014-10-23 LAB — PSA: PSA: 0.77 ng/mL (ref 0.10–4.00)

## 2014-10-23 LAB — LIPID PANEL
Cholesterol: 174 mg/dL (ref 0–200)
HDL: 51.7 mg/dL (ref >39.00)
LDL Cholesterol: 86 mg/dL (ref 0–99)
NonHDL: 122.3
Total CHOL/HDL Ratio: 3
Triglycerides: 183 mg/dL — ABNORMAL HIGH (ref 0.0–149.0)
VLDL: 36.6 mg/dL (ref 0.0–40.0)

## 2014-10-23 LAB — COMPREHENSIVE METABOLIC PANEL
ALT: 27 U/L (ref 0–53)
AST: 25 U/L (ref 0–37)
Albumin: 4.6 g/dL (ref 3.5–5.2)
Alkaline Phosphatase: 85 U/L (ref 39–117)
BILIRUBIN TOTAL: 0.8 mg/dL (ref 0.2–1.2)
BUN: 15 mg/dL (ref 6–23)
CO2: 32 mEq/L (ref 19–32)
Calcium: 10.4 mg/dL (ref 8.4–10.5)
Chloride: 103 mEq/L (ref 96–112)
Creatinine, Ser: 0.94 mg/dL (ref 0.40–1.50)
GFR: 83.09 mL/min (ref >60.00)
Glucose, Bld: 121 mg/dL — ABNORMAL HIGH (ref 70–99)
Potassium: 4.2 mEq/L (ref 3.5–5.1)
SODIUM: 139 meq/L (ref 135–145)
Total Protein: 7.3 g/dL (ref 6.0–8.3)

## 2014-11-12 ENCOUNTER — Ambulatory Visit (INDEPENDENT_AMBULATORY_CARE_PROVIDER_SITE_OTHER): Payer: Medicare PPO | Admitting: Family Medicine

## 2014-11-12 VITALS — BP 154/88 | HR 73 | Temp 97.6°F | Wt 158.2 lb

## 2014-11-12 DIAGNOSIS — L259 Unspecified contact dermatitis, unspecified cause: Secondary | ICD-10-CM | POA: Insufficient documentation

## 2014-11-12 DIAGNOSIS — S30861A Insect bite (nonvenomous) of abdominal wall, initial encounter: Secondary | ICD-10-CM | POA: Diagnosis not present

## 2014-11-12 DIAGNOSIS — W57XXXA Bitten or stung by nonvenomous insect and other nonvenomous arthropods, initial encounter: Secondary | ICD-10-CM

## 2014-11-12 MED ORDER — TRIAMCINOLONE ACETONIDE 0.1 % EX CREA: 1.0000 "application " | TOPICAL_CREAM | Freq: Two times a day (BID) | CUTANEOUS | Status: DC

## 2014-11-12 NOTE — Assessment & Plan Note (Signed)
New- reassurance provided. Likely normal inflammatory reaction to tick bite. Discussed red flag symptoms requiring follow up. Call or return to clinic prn if these symptoms worsen or fail to improve as anticipated. The patient indicates understanding of these issues and agrees with the plan.

## 2014-11-12 NOTE — Assessment & Plan Note (Signed)
New- consistent with allergic/plant dermatitis. Advised to not restart topical benadryl as it can often irritate the skin. Oral benadryl prn itching. Triamcinolone eRx sent to use on rash on legs twice daily for no more than 7 days without updating [place]. Call or return to clinic prn if these symptoms worsen or fail to improve as anticipated. The patient indicates understanding of these issues and agrees with the plan.

## 2014-11-12 NOTE — Progress Notes (Signed)
Subjective:   Patient ID: Michael Kramer, male    DOB: [birth year], 74 y.o.   MRN: [id number]  Michael Kramer is a pleasant 74 y.o. year old male who presents to clinic today with Insect Bite  on 11/12/2014  HPI:  1.  Rash- itchy rash on LE bilaterally.  Noticed it over a week ago after he was chopping down shrubs and trees in his wooded back yard area.  Topical benadryl helped with the itching but worsened the rash so he stopped using it.  No weeping.  Feels it is getting a little better--not as itchy.  2.  Tick bite- two days ago, noticed a tick on the base of the shaft of his penis.  His wife removed the tick with tweezers.  Since then, there has been a raised area where the tick was embedded.  He does not think the tick was embedded for very long.  No erythema, warmth or pain at the tick bite area.  No other rashes other than the rash that started on his legs the week prior. No fevers, chills, HA, nausea, vomiting or sensitivity to light.  Current Outpatient Prescriptions on File Prior to Visit  Medication Sig Dispense Refill  . acetaminophen (TYLENOL) 500 MG tablet Take 500 mg by mouth every 6 (six) hours as needed. pain    . amLODipine (NORVASC) 5 MG tablet Take 1 tablet (5 mg total) by mouth daily. 90 tablet 3  . amLODipine-olmesartan (AZOR) 5-40 MG per tablet Take 1 tablet by mouth daily. 90 tablet 3  . fish oil-omega-3 fatty acids 1000 MG capsule Take 1,200 mg by mouth daily.    . lansoprazole (PREVACID) 15 MG capsule Take 1 capsule (15 mg total) by mouth daily. 90 capsule 3  . Multiple Vitamins-Minerals (MULTIVITAMIN WITH MINERALS) tablet Take 1 tablet by mouth daily.     No current facility-administered medications on file prior to visit.    No Known Allergies  Past Medical History  Diagnosis Date  . Allergy   . GERD (gastroesophageal reflux disease)   . Hypertension   . Arthritis   . Cancer     bladder  . BPH (benign prostatic hypertrophy)   . Shingles   . Anxiety      white coat syndrome    Past Surgical History  Procedure Laterality Date  . Cholecystectomy  1982  . Tonsillectomy and adenoidectomy  1948  . Doppler echocardiography  1997    normal  . Electrocardiogram    . Holter moniter      normal  . Cartoid      normal  . Turbt    . Bladder cancer  10/2006    treated with TB Virus    Family History  Problem Relation Age of Onset  . Emphysema Mother     smoker  . Hypertension Paternal Grandfather     History   Social History  . Marital Status: Married    Spouse Name: N/A  . Number of Children: 4  . 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 Smoking status: Former Smoker    Types: Cigarettes    Quit date: 01/20/1987  . Smokeless tobacco: Never Used  . Alcohol Use: No  . Drug Use: No  . Sexual Activity: Not on file   Other Topics Concern  . Not on file   Social History Narrative   Hobbies: golf and sports  Regular exercise--yes golf 3 times a week   Sleeps 8-9 hours per night         The PMH, PSH, Social History, Family History, Medications, and allergies have been reviewed in [facility], and have been updated if relevant.   Review of Systems  Constitutional: Negative.   HENT: Negative.   Eyes: Negative.   Respiratory: Negative.   Cardiovascular: Negative.   Gastrointestinal: Negative.   Endocrine: Negative.   Genitourinary: Negative.   Musculoskeletal: Negative.   Skin: Positive for rash.  Neurological: Negative.   Psychiatric/Behavioral: Negative.   All other systems reviewed and are negative.      Objective:    BP 154/88 mmHg  Pulse 73  Temp(Src) 97.6 F (36.4 C) (Oral)  Wt 158 lb 4 oz (71.782 kg)  SpO2 96%   Physical Exam  Constitutional: He is oriented to person, place, and time. He appears well-developed and well-nourished. No distress.  HENT:  Head: Normocephalic and atraumatic.  Eyes: Conjunctivae are normal.    Cardiovascular: Normal rate.   Pulmonary/Chest: Effort normal.  Genitourinary:     Neurological: He is alert and oriented to person, place, and time.  Skin: Rash noted.     Psychiatric: He has a normal mood and affect. His behavior is normal. Judgment and thought content normal.  Nursing note and vitals reviewed.         Assessment & Plan:   Contact dermatitis  Tick bite of groin, initial encounter No Follow-up on file.

## 2014-11-12 NOTE — Progress Notes (Signed)
Pre visit review using our clinic review tool, if applicable. No additional management support is needed unless otherwise documented below in the visit note. 

## 2014-11-12 NOTE — Patient Instructions (Signed)
Good to see you. Keep an eye on the area- no signs of infection currently.  Apply triamcinolone to rash on your legs twice daily- no longer than a week if no improvement.  Oral benadryl as needed for itching (it can make you sleepy).  Have a great holiday weekend.

## 2014-12-01 DIAGNOSIS — H2513 Age-related nuclear cataract, bilateral: Secondary | ICD-10-CM | POA: Diagnosis not present

## 2014-12-01 DIAGNOSIS — R69 Illness, unspecified: Secondary | ICD-10-CM | POA: Diagnosis not present

## 2014-12-01 DIAGNOSIS — H25013 Cortical age-related cataract, bilateral: Secondary | ICD-10-CM | POA: Diagnosis not present

## 2014-12-01 DIAGNOSIS — H43393 Other vitreous opacities, bilateral: Secondary | ICD-10-CM | POA: Diagnosis not present

## 2015-03-22 DIAGNOSIS — Z129 Encounter for screening for malignant neoplasm, site unspecified: Secondary | ICD-10-CM | POA: Diagnosis not present

## 2015-03-22 DIAGNOSIS — Z8551 Personal history of malignant neoplasm of bladder: Secondary | ICD-10-CM | POA: Diagnosis not present

## 2015-04-06 DIAGNOSIS — D1800 Hemangioma unspecified site: Secondary | ICD-10-CM | POA: Diagnosis not present

## 2015-04-06 DIAGNOSIS — L820 Inflamed seborrheic keratosis: Secondary | ICD-10-CM | POA: Diagnosis not present

## 2015-04-06 DIAGNOSIS — L821 Other seborrheic keratosis: Secondary | ICD-10-CM | POA: Diagnosis not present

## 2015-04-06 DIAGNOSIS — L570 Actinic keratosis: Secondary | ICD-10-CM | POA: Diagnosis not present

## 2015-04-06 DIAGNOSIS — Z1283 Encounter for screening for malignant neoplasm of skin: Secondary | ICD-10-CM | POA: Diagnosis not present

## 2015-04-06 DIAGNOSIS — D692 Other nonthrombocytopenic purpura: Secondary | ICD-10-CM | POA: Diagnosis not present

## 2015-05-04 ENCOUNTER — Encounter: Payer: Self-pay | Admitting: Gastroenterology

## 2015-06-15 ENCOUNTER — Encounter: Payer: Self-pay | Admitting: Gastroenterology

## 2015-08-09 ENCOUNTER — Ambulatory Visit (AMBULATORY_SURGERY_CENTER): Payer: Self-pay | Admitting: *Deleted

## 2015-08-09 VITALS — Ht 68.0 in | Wt 158.0 lb

## 2015-08-09 DIAGNOSIS — Z86010 Personal history of colonic polyps: Secondary | ICD-10-CM

## 2015-08-09 MED ORDER — NA SULFATE-K SULFATE-MG SULF 17.5-3.13-1.6 GM/177ML PO SOLN: 1.0000 | Freq: Once | ORAL | Status: DC

## 2015-08-09 NOTE — Progress Notes (Signed)
No egg or soy allergy known to patient  No issues with past sedation with any surgeries  or procedures, no intubation problems  No diet pills No home 02 use per patient   emmi declined   

## 2015-08-26 ENCOUNTER — Encounter: Payer: Self-pay | Admitting: Gastroenterology

## 2015-08-26 ENCOUNTER — Ambulatory Visit (AMBULATORY_SURGERY_CENTER): Payer: Medicare PPO | Admitting: Gastroenterology

## 2015-08-26 VITALS — BP 107/66 | HR 73 | Temp 96.5°F | Resp 25 | Ht 68.0 in | Wt 158.0 lb

## 2015-08-26 DIAGNOSIS — D124 Benign neoplasm of descending colon: Secondary | ICD-10-CM

## 2015-08-26 DIAGNOSIS — Z86010 Personal history of colonic polyps: Secondary | ICD-10-CM

## 2015-08-26 DIAGNOSIS — D125 Benign neoplasm of sigmoid colon: Secondary | ICD-10-CM

## 2015-08-26 DIAGNOSIS — I10 Essential (primary) hypertension: Secondary | ICD-10-CM | POA: Diagnosis not present

## 2015-08-26 DIAGNOSIS — D123 Benign neoplasm of transverse colon: Secondary | ICD-10-CM

## 2015-08-26 DIAGNOSIS — K219 Gastro-esophageal reflux disease without esophagitis: Secondary | ICD-10-CM | POA: Diagnosis not present

## 2015-08-26 MED ORDER — SODIUM CHLORIDE 0.9 % IV SOLN: 500.0000 mL | INTRAVENOUS | Status: DC

## 2015-08-26 NOTE — Progress Notes (Signed)
Report to PACU, RN, vss, BBS= Clear.  

## 2015-08-26 NOTE — Patient Instructions (Signed)
YOU HAD AN ENDOSCOPIC PROCEDURE TODAY AT [facility]:   Refer to the procedure report that was given to you for any specific questions about what was found during the examination.  If the procedure report does not answer your questions, please call your gastroenterologist to clarify.  If you requested that your care partner not be given the details of your procedure findings, then the procedure report has been included in a sealed envelope for you to review at your convenience later.  YOU SHOULD EXPECT: Some feelings of bloating in the abdomen. Passage of more gas than usual.  Walking can help get rid of the air that was put into your GI tract during the procedure and reduce the bloating. If you had a lower endoscopy (such as a colonoscopy or flexible sigmoidoscopy) you may notice spotting of blood in your stool or on the toilet paper. If you underwent a bowel prep for your procedure, you may not have a normal bowel movement for a few days.  Please Note:  You might notice some irritation and congestion in your nose or some drainage.  This is from the oxygen used during your procedure.  There is no need for concern and it should clear up in a day or so.  SYMPTOMS TO REPORT IMMEDIATELY:   Following lower endoscopy (colonoscopy or flexible sigmoidoscopy):  Excessive amounts of blood in the stool  Significant tenderness or worsening of abdominal pains  Swelling of the abdomen that is new, acute  Fever of 100F or higher   For urgent or emergent issues, a gastroenterologist can be reached at any hour by calling [phone].   DIET: Your first meal following the procedure should be a small meal and then it is ok to progress to your normal diet. Heavy or fried foods are harder to digest and may make you feel nauseous or bloated.  Likewise, meals heavy in dairy and vegetables can increase bloating.  Drink plenty of fluids but you should avoid alcoholic beverages for 24 hours.  Try to  increase the fiber in your diet due to your Diverticulosis.  ACTIVITY:  You should plan to take it easy for the rest of today and you should NOT DRIVE or use heavy machinery until tomorrow (because of the sedation medicines used during the test).    FOLLOW UP: Our staff will call the number listed on your records the next business day following your procedure to check on you and address any questions or concerns that you may have regarding the information given to you following your procedure. If we do not reach you, we will leave a message.  However, if you are feeling well and you are not experiencing any problems, there is no need to return our call.  We will assume that you have returned to your regular daily activities without incident.  If any biopsies were taken you will be contacted by phone or by letter within the next 1-3 weeks.  Please call us at [phone] if you have not heard about the biopsies in 3 weeks.    SIGNATURES/CONFIDENTIALITY: You and/or your care partner have signed paperwork which will be entered into your electronic medical record.  These signatures attest to the fact that that the information above on your After Visit Summary has been reviewed and is understood.  Full responsibility of the confidentiality of this discharge information lies with you and/or your care-partner.  AVOID nsaids and aspirin for two weeks per Dr. Havery Moros.  Thank-you for choosing [place] for your medical needs.

## 2015-08-26 NOTE — Progress Notes (Signed)
Called to room to assist during endoscopic procedure.  Patient ID and intended procedure confirmed with present staff. Received instructions for my participation in the procedure from the performing physician.  

## 2015-08-26 NOTE — Op Note (Signed)
[facility]  [address] [place], [id number]   COLONOSCOPY PROCEDURE REPORT  PATIENT: Michael Kramer, Michael Kramer  MR#: [id number] BIRTHDATE: April 23, 1941 , 23  yrs. old GENDER: male ENDOSCOPIST: Yetta Flock, MD REFERRED BY: Arnette Norris MD PROCEDURE DATE:  08/26/2015 PROCEDURE:   Colonoscopy, surveillance and Colonoscopy with snare polypectomy First Screening Colonoscopy - Avg.  risk and is 50 yrs.  old or older - No.  Prior Negative Screening - Now for repeat screening. N/A  History of Adenoma - Now for follow-up colonoscopy & has been > or = to 3 yrs.  Yes hx of adenoma.  Has been 3 or more years since last colonoscopy.  Polyps removed today? Yes ASA CLASS:   Class II INDICATIONS:Surveillance due to prior colonic neoplasia and Colorectal Neoplasm Risk Assessment for this procedure is average risk. MEDICATIONS: Propofol 300 mg IV  DESCRIPTION OF PROCEDURE:   After the risks benefits and alternatives of the procedure were thoroughly explained, informed consent was obtained.  The digital rectal exam revealed no abnormalities of the rectum.   The [id number]  endoscope was introduced through the anus and advanced to the cecum, which was identified by both the appendix and ileocecal valve. No adverse events experienced.   The quality of the prep was adequate  The instrument was then slowly withdrawn as the colon was fully examined. Estimated blood loss is zero unless otherwise noted in this procedure report.    COLON FINDINGS: A sessile polyp measuring 3 mm in size was found in the transverse colon.  A polypectomy was performed with a cold snare.  The resection was complete, the polyp tissue was completely retrieved and sent to histology.   A sessile polyp measuring 5 mm in size was found in the descending colon.  A polypectomy was performed with a cold snare.  The resection was complete, the polyp tissue was completely retrieved and sent to histology.    A pedunculated polyp measuring 6 mm in size was found in the sigmoid colon.  A polypectomy was performed using snare cautery.  The resection was complete, the polyp tissue was completely retrieved and sent to histology.   There was mild diverticulosis noted in the sigmoid colon and ascending colon.   The colon was tortous.   The examination was otherwise normal.  Retroflexed views revealed internal hemorrhoids. The time to cecum = 7.2 (including polypectomy) Withdrawal time = 17.3   The scope was withdrawn and the procedure completed. COMPLICATIONS: There were no immediate complications.  ENDOSCOPIC IMPRESSION: 1.   Sessile polyp was found in the transverse colon; polypectomy was performed with a cold snare 2.   Sessile polyp was found in the descending colon; polypectomy was performed with a cold snare 3.   Pedunculated polyp was found in the sigmoid colon; polypectomy was performed using snare cautery 4.   Mild diverticulosis was noted in the sigmoid colon and ascending colon 5.   The colon was tortous 6.   The examination was otherwise normal    RECOMMENDATIONS: 1.  Hold Aspirin and all other NSAIDS for 2 weeks. 2.  Resume diet 3.  Resume medications 4.  Await pathology results  eSigned:  Yetta Flock, MD 08/26/2015 11:32 AM   cc: Arnette Norris MD   PATIENT NAME:  Michael Kramer MR#: [id number]

## 2015-08-27 ENCOUNTER — Telehealth: Payer: Self-pay | Admitting: *Deleted

## 2015-08-27 NOTE — Telephone Encounter (Signed)
No answer, message left for the patient. 

## 2015-08-30 ENCOUNTER — Other Ambulatory Visit: Payer: Self-pay | Admitting: Family Medicine

## 2015-08-31 ENCOUNTER — Telehealth: Payer: Self-pay

## 2015-08-31 NOTE — Telephone Encounter (Signed)
Yes I am ok with that but if he would like [place] to recheck his cholesterol and liver function in a couple of months, that is certainly reasonable.

## 2015-08-31 NOTE — Telephone Encounter (Signed)
Pt said he has been taking the name brand of Azor and now mail order said will be sending generic Azor; pt concerned and wants to make sure it is OK with Dr Deborra Medina for pt to go on generic Azor. Pt request cb 08/31/15.

## 2015-08-31 NOTE — Telephone Encounter (Signed)
Patient notified as instructed by telephone and verbalized understanding. . Patent stated that he has not started taking the generic medication yet and should be getting it soon from the mail order pharmacy. Patient stated that he will call back and schedule lab appointment as instructed after he starts taking the generic Azor.

## 2015-09-01 ENCOUNTER — Encounter: Payer: Self-pay | Admitting: Gastroenterology

## 2015-09-03 ENCOUNTER — Other Ambulatory Visit: Payer: Self-pay

## 2015-09-03 MED ORDER — AMLODIPINE-OLMESARTAN 5-40 MG PO TABS: 1.0000 | ORAL_TABLET | Freq: Every day | ORAL | Status: DC

## 2015-09-03 NOTE — Telephone Encounter (Signed)
Pt received letter today that namebrand Azor is approved; pt will bring letter on 09/06/15 to get med changed to namebrand for mail order refill. Pt will be out of med Sunday. # 30 sent to [facility] per pt request.

## 2015-09-13 ENCOUNTER — Other Ambulatory Visit: Payer: Self-pay

## 2015-09-13 MED ORDER — AMLODIPINE BESYLATE 5 MG PO TABS: 5.0000 mg | ORAL_TABLET | Freq: Every day | ORAL | Status: DC

## 2015-09-13 MED ORDER — LANSOPRAZOLE 15 MG PO CPDR: 15.0000 mg | DELAYED_RELEASE_CAPSULE | Freq: Every day | ORAL | Status: DC

## 2015-09-13 MED ORDER — AMLODIPINE-OLMESARTAN 5-40 MG PO TABS: 1.0000 | ORAL_TABLET | Freq: Every day | ORAL | Status: DC

## 2015-09-13 NOTE — Telephone Encounter (Signed)
Ok to refill one time only.  Needs med refill follow up for further refills.

## 2015-09-13 NOTE — Telephone Encounter (Signed)
Pt left v/m requesting 90 day refills of lansoprazole(last refilled # 90 x 3 on 10/14/14), amlodipine(last refilled # 90 x 3 on 10/14/14) and name brand Azor(last refilled # 30 on 09/03/15) to [facility]. Pt request cb when done. Pt last seen f/u 10/20/13 and last acute visit 11/12/14; no future appt scheduled. Please advise.

## 2015-11-03 ENCOUNTER — Other Ambulatory Visit: Payer: Self-pay | Admitting: Internal Medicine

## 2015-11-03 DIAGNOSIS — E785 Hyperlipidemia, unspecified: Secondary | ICD-10-CM

## 2015-11-10 ENCOUNTER — Other Ambulatory Visit (INDEPENDENT_AMBULATORY_CARE_PROVIDER_SITE_OTHER): Payer: Medicare PPO

## 2015-11-10 DIAGNOSIS — E785 Hyperlipidemia, unspecified: Secondary | ICD-10-CM | POA: Diagnosis not present

## 2015-11-10 LAB — LIPID PANEL
CHOLESTEROL: 156 mg/dL (ref 0–200)
HDL: 50.4 mg/dL (ref >39.00)
LDL Cholesterol: 76 mg/dL (ref 0–99)
NonHDL: 105.1
TRIGLYCERIDES: 148 mg/dL (ref 0.0–149.0)
Total CHOL/HDL Ratio: 3
VLDL: 29.6 mg/dL (ref 0.0–40.0)

## 2015-11-10 LAB — COMPREHENSIVE METABOLIC PANEL
ALBUMIN: 4.6 g/dL (ref 3.5–5.2)
ALK PHOS: 79 U/L (ref 39–117)
ALT: 26 U/L (ref 0–53)
AST: 27 U/L (ref 0–37)
BUN: 15 mg/dL (ref 6–23)
CALCIUM: 10.3 mg/dL (ref 8.4–10.5)
CHLORIDE: 101 meq/L (ref 96–112)
CO2: 32 mEq/L (ref 19–32)
Creatinine, Ser: 0.92 mg/dL (ref 0.40–1.50)
GFR: 84.94 mL/min (ref >60.00)
Glucose, Bld: 114 mg/dL — ABNORMAL HIGH (ref 70–99)
POTASSIUM: 3.6 meq/L (ref 3.5–5.1)
Sodium: 139 mEq/L (ref 135–145)
TOTAL PROTEIN: 7.7 g/dL (ref 6.0–8.3)
Total Bilirubin: 0.8 mg/dL (ref 0.2–1.2)

## 2015-11-15 ENCOUNTER — Ambulatory Visit (INDEPENDENT_AMBULATORY_CARE_PROVIDER_SITE_OTHER): Payer: Medicare PPO | Admitting: Family Medicine

## 2015-11-15 VITALS — BP 144/72 | HR 90 | Temp 97.6°F | Wt 155.5 lb

## 2015-11-15 DIAGNOSIS — E7800 Pure hypercholesterolemia, unspecified: Secondary | ICD-10-CM

## 2015-11-15 DIAGNOSIS — C679 Malignant neoplasm of bladder, unspecified: Secondary | ICD-10-CM | POA: Diagnosis not present

## 2015-11-15 DIAGNOSIS — I10 Essential (primary) hypertension: Secondary | ICD-10-CM

## 2015-11-15 MED ORDER — AMLODIPINE-OLMESARTAN 5-40 MG PO TABS: 1.0000 | ORAL_TABLET | Freq: Every day | ORAL | Status: DC

## 2015-11-15 MED ORDER — LANSOPRAZOLE 15 MG PO CPDR: 15.0000 mg | DELAYED_RELEASE_CAPSULE | Freq: Every day | ORAL | Status: DC

## 2015-11-15 MED ORDER — AMLODIPINE BESYLATE 5 MG PO TABS: 5.0000 mg | ORAL_TABLET | Freq: Every day | ORAL | Status: DC

## 2015-11-15 NOTE — Assessment & Plan Note (Signed)
Recent negative cystoscopy. Followed by urology.

## 2015-11-15 NOTE — Assessment & Plan Note (Signed)
Well controlled. No changes made to rxs today. 

## 2015-11-15 NOTE — Progress Notes (Signed)
Very pleasant 75 yo here for follow up.    Doing well, still golfing regularly.   HTN- on Azor 5-40 mg and Norvasc 5 mg daily. No CP, SOB, blurred vision or LE edema.   Lab Results  Component Value Date   CREATININE 0.92 11/10/2015    H/o bladder CA - seeing urology yearly now instead of twice a year. Remote h/o BCG.  Sees Dr. Bernardo Heater.  Just saw him in 03/2015- per pt, cystoscopy neg.   No hematuria, urinary frequency or dysuria.    Lab Results  Component Value Date   CHOL 156 11/10/2015   HDL 50.40 11/10/2015   LDLCALC 76 11/10/2015   LDLDIRECT 113.3 08/06/2012   TRIG 148.0 11/10/2015   CHOLHDL 3 11/10/2015    Patient Active Problem List   Diagnosis Date Noted  . Dupuytren's contracture of both hands 10/14/2014  . GERD (gastroesophageal reflux disease) 05/25/2011  . PURE HYPERCHOLESTEROLEMIA 05/20/2009  . Malignant neoplasm of bladder ([place]) 02/13/2007  . ANXIETY 02/13/2007  . MIGRAINE WITH AURA 02/13/2007  . HYPERTENSION, BENIGN ESSENTIAL 02/13/2007  . Allergic rhinitis, cause unspecified 02/13/2007  . BENIGN PROSTATIC HYPERTROPHY 02/13/2007  . OSTEOARTHRITIS 02/13/2007  . GLUCOSE INTOLERANCE, HX OF 02/13/2007   Past Medical History  Diagnosis Date  . Allergy   . GERD (gastroesophageal reflux disease)   . Hypertension   . Arthritis   . Cancer ([place])     bladder  . BPH (benign prostatic hypertrophy)   . Shingles   . Anxiety     white coat syndrome   Past Surgical History  Procedure Laterality Date  . Cholecystectomy  1982  . Tonsillectomy  1948    age 15 -77  . Doppler echocardiography  1997    normal  . Electrocardiogram    . Holter moniter      normal  . Cartoid      normal  . Transurethral resection of prostate    . Bladder cancer  10/2006    treated with TB Virus  . Colonoscopy    . Polypectomy     Social History  Substance Use Topics  . Smoking status: Former Smoker    Types: Cigarettes    Quit date: 01/20/1987  . Smokeless tobacco: Never  Used  . Alcohol Use: No   Family History  Problem Relation Age of Onset  . Emphysema Mother     smoker  . Hypertension Paternal Grandfather   . Colon cancer Neg Hx   . Colon polyps Neg Hx   . Esophageal cancer Neg Hx   . Rectal cancer Neg Hx   . Stomach cancer Neg Hx    No Known Allergies Current Outpatient Prescriptions on File Prior to Visit  Medication Sig Dispense Refill  . acetaminophen (TYLENOL) 500 MG tablet Take 500 mg by mouth every 6 (six) hours as needed. pain    . amLODipine (NORVASC) 5 MG tablet Take 1 tablet (5 mg total) by mouth daily. OFFICE VISIT REQUIRED FOR ADDITIONAL REFILLS 30 tablet 0  . amLODipine-olmesartan (AZOR) 5-40 MG tablet Take 1 tablet by mouth daily. OFFICE VISIT REQUIRED FOR ADDITIONAL REFILLS 30 tablet 0  . fish oil-omega-3 fatty acids 1000 MG capsule Take 1,200 mg by mouth daily.    . lansoprazole (PREVACID) 15 MG capsule Take 1 capsule (15 mg total) by mouth daily. OFFICE VISIT REQUIRED FOR ADDITIONAL REFILLS 30 capsule 0  . Multiple Vitamins-Minerals (MULTIVITAMIN WITH MINERALS) tablet Take 1 tablet by mouth daily.    . vitamin  C (ASCORBIC ACID) 500 MG tablet Take 500 mg by mouth daily.     No current facility-administered medications on file prior to visit.   The PMH, PSH, Social History, Family History, Medications, and allergies have been reviewed in [facility], and have been updated if relevant.  Review of Systems  Constitutional: Negative.   Eyes: Negative.   Respiratory: Negative.   Cardiovascular: Negative.   Gastrointestinal: Negative.   Endocrine: Negative.   Genitourinary: Negative.   Musculoskeletal: Negative.   Skin: Negative.   Allergic/Immunologic: Negative.   Neurological: Negative.   Hematological: Negative.   All other systems reviewed and are negative.   Physical Exam  BP 144/72 mmHg  Pulse 90  Temp(Src) 97.6 F (36.4 C) (Oral)  Wt 155 lb 8 oz (70.534 kg)  SpO2 92%  BP Readings from Last 3 Encounters:  11/15/15  144/72  08/26/15 107/66  11/12/14 154/88     General: alert, well-developed, well-nourished, and well-hydrated. thin, NAD  Head: normocephalic and atraumatic.  Eyes: vision grossly intact, pupils equal, pupils round, and pupils reactive to light.  Ears: R ear normal and L ear normal.  Nose: no external deformity.  Mouth: good dentition.  Lungs: Normal respiratory effort, chest expands symmetrically. Lungs are clear to auscultation, no crackles or wheezes.  Heart: Normal rate and regular rhythm. S1 and S2 normal without gallop, murmur, click, rub or other extra sounds.  Abdomen: Bowel sounds positive,abdomen soft and non-tender without masses, organomegaly or hernias noted.  Msk: No deformity or scoliosis noted of thoracic or lumbar spine.  Extremities: No clubbing, cyanosis, edema, or deformity noted with normal full range of motion of all joints.  Neurologic: alert & oriented X3 and gait normal.  Skin: Intact without suspicious lesions or rashes  Psych: Cognition and judgment appear intact. Alert and cooperative with normal attention span and concentration. No apparent delusions, illusions, hallucinations

## 2015-11-15 NOTE — Progress Notes (Signed)
Pre visit review using our clinic review tool, if applicable. No additional management support is needed unless otherwise documented below in the visit note. 

## 2015-11-15 NOTE — Assessment & Plan Note (Signed)
Well controlled with diet and fish oil.

## 2016-02-18 ENCOUNTER — Encounter: Payer: Self-pay | Admitting: Internal Medicine

## 2016-02-18 ENCOUNTER — Ambulatory Visit (INDEPENDENT_AMBULATORY_CARE_PROVIDER_SITE_OTHER): Payer: Medicare PPO | Admitting: Internal Medicine

## 2016-02-18 VITALS — BP 126/70 | HR 85 | Temp 97.9°F | Wt 150.0 lb

## 2016-02-18 DIAGNOSIS — T7840XA Allergy, unspecified, initial encounter: Secondary | ICD-10-CM | POA: Diagnosis not present

## 2016-02-18 MED ORDER — TRIAMCINOLONE ACETONIDE 0.1 % EX CREA: 1.0000 "application " | TOPICAL_CREAM | Freq: Two times a day (BID) | CUTANEOUS | Status: DC | PRN

## 2016-02-18 MED ORDER — PREDNISONE 20 MG PO TABS: 40.0000 mg | ORAL_TABLET | Freq: Every day | ORAL | Status: DC

## 2016-02-18 NOTE — Progress Notes (Signed)
Pre visit review using our clinic review tool, if applicable. No additional management support is needed unless otherwise documented below in the visit note. 

## 2016-02-18 NOTE — Patient Instructions (Signed)
Please try the prescription cream and over the counter cetirizine daily (10mg ) for the itching. If your itching worsens, fill and take the prednisone

## 2016-02-18 NOTE — Assessment & Plan Note (Signed)
Looks like reaction to bites Discussed looking for any possible cause--- like those jeans, etc Will give cream Prednisone if worsens

## 2016-02-18 NOTE — Progress Notes (Signed)
Subjective:    Patient ID: Michael Kramer, male    DOB: 03/17/1941, 75 y.o.   MRN: [id number]  HPI Here due to rash  Was out yesterday and noted itching on right leg--just going to post office Then noticed rash Itching was severe last night Did notice a few on other leg--but not itchy  No clear exposures Did work in the yard a couple of days ago---watering and clipping shrubs (not in woods)  No new clothes, cleansers, etc Did notice a different material to jeans he recently wore--but didn't think they were new  Used wife's itch cream--helped some Current Outpatient Prescriptions on File Prior to Visit  Medication Sig Dispense Refill  . acetaminophen (TYLENOL) 500 MG tablet Take 500 mg by mouth every 6 (six) hours as needed. pain    . amLODipine (NORVASC) 5 MG tablet Take 1 tablet (5 mg total) by mouth daily. 90 tablet 3  . amLODipine-olmesartan (AZOR) 5-40 MG tablet Take 1 tablet by mouth daily. 90 tablet 3  . fish oil-omega-3 fatty acids 1000 MG capsule Take 1,200 mg by mouth daily.    . lansoprazole (PREVACID) 15 MG capsule Take 1 capsule (15 mg total) by mouth daily. 90 capsule 3  . Multiple Vitamins-Minerals (MULTIVITAMIN WITH MINERALS) tablet Take 1 tablet by mouth daily.    . vitamin C (ASCORBIC ACID) 500 MG tablet Take 500 mg by mouth daily.     No current facility-administered medications on file prior to visit.    No Known Allergies  Past Medical History  Diagnosis Date  . Allergy   . GERD (gastroesophageal reflux disease)   . Hypertension   . Arthritis   . Cancer ([place])     bladder  . BPH (benign prostatic hypertrophy)   . Shingles   . Anxiety     white coat syndrome    Past Surgical History  Procedure Laterality Date  . Cholecystectomy  1982  . Tonsillectomy  1948    age 75 -78  . Doppler echocardiography  1997    normal  . Electrocardiogram    . Holter moniter      normal  . Cartoid      normal  . Transurethral resection of prostate    .  Bladder cancer  10/2006    treated with TB Virus  . Colonoscopy    . Polypectomy      Family History  Problem Relation Age of Onset  . Emphysema Mother     smoker  . Hypertension Paternal Grandfather   . Colon cancer Neg Hx   . Colon polyps Neg Hx   . Esophageal cancer Neg Hx   . Rectal cancer Neg Hx   . Stomach cancer Neg Hx     Social History   Social History  . Marital Status: Married    Spouse Name: N/A  . Number of Children: 4  . 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 Smoking status: Former Smoker    Types: Cigarettes    Quit date: 01/20/1987  . Smokeless tobacco: Never Used  . Alcohol Use: No  . Drug Use: No  . Sexual Activity: Not on file   Other Topics Concern  . Not on file   Social History Narrative   Hobbies: golf and sports   Regular exercise--yes golf 3 times a week   Sleeps 8-9 hours per night  Review of Systems  No fever No cough. Slight wheezing---nothing new (like golfing in the heat). No SOB No lip swelling      Objective:   Physical Exam  Skin:  Multiple (?10-15) non blanching red lesions (around 10-45mm diameter) with mild induration on right leg 1 on lateral left leg          Assessment & Plan:

## 2016-05-11 DIAGNOSIS — Z8551 Personal history of malignant neoplasm of bladder: Secondary | ICD-10-CM | POA: Diagnosis not present

## 2016-07-04 ENCOUNTER — Encounter (HOSPITAL_COMMUNITY): Payer: Self-pay

## 2016-07-04 ENCOUNTER — Telehealth: Payer: Self-pay | Admitting: Family Medicine

## 2016-07-04 ENCOUNTER — Emergency Department (HOSPITAL_COMMUNITY)
Admission: EM | Admit: 2016-07-04 | Discharge: 2016-07-04 | Disposition: A | Discharge status: Home / Self Care | Payer: Medicare PPO | Attending: Emergency Medicine | Admitting: Emergency Medicine

## 2016-07-04 DIAGNOSIS — Z79899 Other long term (current) drug therapy: Secondary | ICD-10-CM | POA: Diagnosis not present

## 2016-07-04 DIAGNOSIS — I10 Essential (primary) hypertension: Secondary | ICD-10-CM | POA: Insufficient documentation

## 2016-07-04 DIAGNOSIS — Z8551 Personal history of malignant neoplasm of bladder: Secondary | ICD-10-CM | POA: Insufficient documentation

## 2016-07-04 DIAGNOSIS — Z87891 Personal history of nicotine dependence: Secondary | ICD-10-CM | POA: Insufficient documentation

## 2016-07-04 DIAGNOSIS — K625 Hemorrhage of anus and rectum: Secondary | ICD-10-CM

## 2016-07-04 LAB — COMPREHENSIVE METABOLIC PANEL
ALBUMIN: 4.1 g/dL (ref 3.5–5.0)
ALK PHOS: 73 U/L (ref 38–126)
ALT: 29 U/L (ref 17–63)
AST: 30 U/L (ref 15–41)
Anion gap: 9 (ref 5–15)
BUN: 16 mg/dL (ref 6–20)
CALCIUM: 10 mg/dL (ref 8.9–10.3)
CO2: 25 mmol/L (ref 22–32)
CREATININE: 0.95 mg/dL (ref 0.61–1.24)
Chloride: 103 mmol/L (ref 101–111)
GFR calc Af Amer: >60 mL/min (ref >60)
GFR calc non Af Amer: >60 mL/min (ref >60)
GLUCOSE: 189 mg/dL — AB (ref 65–99)
Potassium: 4 mmol/L (ref 3.5–5.1)
SODIUM: 137 mmol/L (ref 135–145)
Total Bilirubin: 1.1 mg/dL (ref 0.3–1.2)
Total Protein: 7 g/dL (ref 6.5–8.1)

## 2016-07-04 LAB — CBC
HCT: 43.7 % (ref 39.0–52.0)
Hemoglobin: 15.4 g/dL (ref 13.0–17.0)
MCH: 31.4 pg (ref 26.0–34.0)
MCHC: 35.2 g/dL (ref 30.0–36.0)
MCV: 89 fL (ref 78.0–100.0)
PLATELETS: 156 10*3/uL (ref 150–400)
RBC: 4.91 MIL/uL (ref 4.22–5.81)
RDW: 13.2 % (ref 11.5–15.5)
WBC: 9.2 10*3/uL (ref 4.0–10.5)

## 2016-07-04 LAB — ABO/RH: ABO/RH(D): O POS

## 2016-07-04 LAB — TYPE AND SCREEN
ABO/RH(D): O POS
Antibody Screen: NEGATIVE

## 2016-07-04 NOTE — Telephone Encounter (Signed)
[facility]     Patient Name: ZAKARIYAH DEAMER Initial Comment Caller's husband is having rectal bleeding.   DOB: October 28, 1940      Nurse Assessment  Nurse: Venetia Maxon, RN, Manuela Schwartz Date/Time Eilene Ghazi Time): 07/04/2016 4:18:47 PM  Confirm and document reason for call. If symptomatic, describe symptoms. You must click the next button to save text entered. ---Caller's husband is having rectal bleeding. Dr. is Deborra Medina. Bleeding began yesterday. He is not on blood thinners He had a normal BM yesterday and had blood in toilet water. and again today. He is weak. not himself. no fever  Has the patient traveled out of the country within the last 30 days? ---No  Does the patient have any new or worsening symptoms? ---Yes  Will a triage be completed? ---Yes  Related visit to physician within the last 2 weeks? ---No  Does the PT have any chronic conditions? (i.e. diabetes, asthma, etc.) ---Yes  List chronic conditions. ---bladder cancer : did not have chemo or radiation surgical removed Jan 2017 HTN  Is this a behavioral health or substance abuse call? ---No    Guidelines     Guideline Title Affirmed Question Affirmed Notes   Rectal Bleeding [1] Constant abdominal pain AND [2] present > 2 hours    Final Disposition User   Go to ED Now Venetia Maxon, RN, [facility] - ED   Disagree/Comply: Comply

## 2016-07-04 NOTE — Discharge Instructions (Signed)
As discussed, your evaluation today has been largely reassuring.  But, it is important that you monitor your condition carefully, and do not hesitate to return to the ED if you develop new, or concerning changes in your condition. ? ?Otherwise, please follow-up with your physician for appropriate ongoing care. ? ?

## 2016-07-04 NOTE — Telephone Encounter (Signed)
Per chart review tab pt is at [place] now. 

## 2016-07-04 NOTE — ED Triage Notes (Signed)
Pt reports blood in the stool that began Sunday. Pt reports one episode on Sunday, denies blood in stool yesterday, and 2 episodes today. Pt denies abd pain. He denies n/v. Pt reports hx of polyps.

## 2016-07-04 NOTE — ED Provider Notes (Signed)
[facility] DEPT Provider Note   CSN: [id number] Arrival date & time: 07/04/16  1714  History   Chief Complaint Chief Complaint  Patient presents with  . Rectal Bleeding    HPI Michael Kramer is a 75 y.o. male.   Rectal Bleeding  Quality:  Bright red Duration:  3 days Timing:  Intermittent Chronicity:  New Context: hemorrhoids   Context comment:  Polyps Similar prior episodes: yes   Associated symptoms: no abdominal pain, no dizziness, no epistaxis, no hematemesis, no light-headedness, no loss of consciousness and no vomiting     Past Medical History:  Diagnosis Date  . Allergy   . Anxiety    white coat syndrome  . Arthritis   . BPH (benign prostatic hypertrophy)   . Cancer ([place])    bladder  . GERD (gastroesophageal reflux disease)   . Hypertension   . Shingles     Patient Active Problem List   Diagnosis Date Noted  . Allergic reaction 02/18/2016  . Dupuytren's contracture of both hands 10/14/2014  . GERD (gastroesophageal reflux disease) 05/25/2011  . PURE HYPERCHOLESTEROLEMIA 05/20/2009  . Malignant neoplasm of bladder ([place]) 02/13/2007  . ANXIETY 02/13/2007  . MIGRAINE WITH AURA 02/13/2007  . HYPERTENSION, BENIGN ESSENTIAL 02/13/2007  . Allergic rhinitis, cause unspecified 02/13/2007  . BENIGN PROSTATIC HYPERTROPHY 02/13/2007  . OSTEOARTHRITIS 02/13/2007  . GLUCOSE INTOLERANCE, HX OF 02/13/2007    Past Surgical History:  Procedure Laterality Date  . bladder cancer  10/2006   treated with TB Virus  . cartoid     normal  . CHOLECYSTECTOMY  1982  . COLONOSCOPY    . DOPPLER ECHOCARDIOGRAPHY  1997   normal  . ELECTROCARDIOGRAM    . holter moniter     normal  . POLYPECTOMY    . TONSILLECTOMY  1948   age 55 -33  . TRANSURETHRAL RESECTION OF PROSTATE         Home Medications    Prior to Admission medications   Medication Sig Start Date End Date Taking? Authorizing Provider  acetaminophen (TYLENOL) 500 MG tablet Take 500 mg by mouth every  6 (six) hours as needed. pain    Historical Provider, MD  amLODipine (NORVASC) 5 MG tablet Take 1 tablet (5 mg total) by mouth daily. 11/15/15   Lucille Passy, MD  amLODipine-olmesartan (AZOR) 5-40 MG tablet Take 1 tablet by mouth daily. 11/15/15   Lucille Passy, MD  fish oil-omega-3 fatty acids 1000 MG capsule Take 1,200 mg by mouth daily.    Historical Provider, MD  lansoprazole (PREVACID) 15 MG capsule Take 1 capsule (15 mg total) by mouth daily. 11/15/15   Lucille Passy, MD  Multiple Vitamins-Minerals (MULTIVITAMIN WITH MINERALS) tablet Take 1 tablet by mouth daily.    Historical Provider, MD  predniSONE (DELTASONE) 20 MG tablet Take 2 tablets (40 mg total) by mouth daily. For 3 days, then 1 tab daily for 3 days. 02/18/16   Venia Carbon, MD  triamcinolone cream (KENALOG) 0.1 % Apply 1 application topically 2 (two) times daily as needed. 02/18/16   Venia Carbon, MD  vitamin C (ASCORBIC ACID) 500 MG tablet Take 500 mg by mouth daily.    Historical Provider, MD    Family History Family History  Problem Relation Age of Onset  . Emphysema Mother     smoker  . Hypertension Paternal Grandfather   . Colon cancer Neg Hx   . Colon polyps Neg Hx   . Esophageal cancer Neg Hx   .  Rectal cancer Neg Hx   . Stomach cancer Neg Hx     Social History Social History  Substance Use Topics  . Smoking status: Former Smoker    Types: Cigarettes    Quit date: 01/20/1987  . Smokeless tobacco: Never Used  . Alcohol use No     Allergies   Patient has no known allergies.   Review of Systems Review of Systems  HENT: Negative for nosebleeds.   Respiratory: Negative for chest tightness and shortness of breath.   Cardiovascular: Negative for chest pain, palpitations and leg swelling.  Gastrointestinal: Positive for blood in stool, constipation and hematochezia. Negative for abdominal pain, diarrhea, hematemesis, nausea, rectal pain and vomiting.  Neurological: Negative for dizziness, loss of  consciousness, syncope and light-headedness.  All other systems reviewed and are negative.    Physical Exam Updated Vital Signs BP 139/75 (BP Location: Right Arm)   Pulse 79   Temp 97.8 F (36.6 C) (Oral)   Resp 20   Ht 5\' 8"  (1.727 m)   Wt 68 kg   SpO2 91%   BMI 22.81 kg/m   Physical Exam  Constitutional: He is oriented to person, place, and time. He appears well-developed and well-nourished. No distress.  HENT:  Head: Normocephalic and atraumatic.  Eyes: EOM are normal. Pupils are equal, round, and reactive to light.  Neck: Normal range of motion. Neck supple.  Cardiovascular: Normal rate and regular rhythm.   Pulmonary/Chest: Effort normal and breath sounds normal. No respiratory distress. He has no wheezes.  Abdominal: Soft. He exhibits no distension and no mass. There is no tenderness. There is no rebound and no guarding.  Genitourinary:  Genitourinary Comments: No blood in rectal vault  No hemorrhoids visualized.  Musculoskeletal: Normal range of motion. He exhibits no edema.  Neurological: He is alert and oriented to person, place, and time. No cranial nerve deficit. Coordination normal.  Skin: He is not diaphoretic.  Nursing note and vitals reviewed.  ED Treatments / Results  Labs (all labs ordered are listed, but only abnormal results are displayed) Labs Reviewed  COMPREHENSIVE METABOLIC PANEL - Abnormal; Notable for the following:       Result Value   Glucose, Bld 189 (*)    All other components within normal limits  CBC  POC OCCULT BLOOD, ED  TYPE AND SCREEN  ABO/RH  Procedures Procedures (including critical care time)  Medications Ordered in ED Medications - No data to display   Initial Impression / Assessment and Plan / ED Course  I have reviewed the triage vital signs and the nursing notes.  Pertinent labs & imaging results that were available during my care of the patient were reviewed by me and considered in my medical decision making (see  chart for details).  Clinical Course    Patient is a 75 year old male with past medical history of colon polyps, hemorrhoids who presents to emergency department today with painless bright red blood in his stools. Patient has history of the same although is many years ago. He has a recent colonoscopy this year which showed polyps. His gastroenterologist recommended follow-up in a few years.He also describes constipation worsening over the past 6-8 months.  Patient denies any signs or symptomatologies of additional hemorrhage. Patient denies any symptomatology of symptomatically anemia. He has no shortness of breath, chest pain or lightheadedness.  Vitals are within normal limits. Physical exam is remarkable for soft nondistended nontender abdomen, normal rectal exam with no frank blood. Discussed possible etiologies of his  bright red blood per rectum.   Given the fact the patient has trace amounts on the toilet paper only with bowel movements, bleeding is more than likely to be secondary to possible internal hemorrhoid.   I gave Mr. Wigfall signs and symptoms to look out for regarding a GI bleed.   Patient discharged home with GI doctor and primary care physician. He is in agreement with this plan and discharged home in good health.  Final Clinical Impressions(s) / ED Diagnoses   Final diagnoses:  Rectal bleeding     Roberto Scales, MD 07/05/16 [id number]    Carmin Muskrat, MD 07/08/16 [phone]

## 2016-10-19 ENCOUNTER — Telehealth: Payer: Self-pay | Admitting: Family Medicine

## 2016-10-19 NOTE — Telephone Encounter (Signed)
LVM for pt to call back and schedule AWV + labs with Lesia and CPE with PCP. °

## 2016-11-15 ENCOUNTER — Encounter: Payer: Self-pay | Admitting: *Deleted

## 2016-11-15 ENCOUNTER — Ambulatory Visit (INDEPENDENT_AMBULATORY_CARE_PROVIDER_SITE_OTHER): Payer: Medicare PPO | Admitting: Family Medicine

## 2016-11-15 ENCOUNTER — Ambulatory Visit (INDEPENDENT_AMBULATORY_CARE_PROVIDER_SITE_OTHER): Payer: Medicare PPO

## 2016-11-15 VITALS — BP 124/80 | HR 91 | Temp 97.8°F | Ht 67.5 in | Wt 147.5 lb

## 2016-11-15 DIAGNOSIS — Z23 Encounter for immunization: Secondary | ICD-10-CM | POA: Diagnosis not present

## 2016-11-15 DIAGNOSIS — I10 Essential (primary) hypertension: Secondary | ICD-10-CM

## 2016-11-15 DIAGNOSIS — Z0000 Encounter for general adult medical examination without abnormal findings: Secondary | ICD-10-CM

## 2016-11-15 DIAGNOSIS — Z125 Encounter for screening for malignant neoplasm of prostate: Secondary | ICD-10-CM | POA: Diagnosis not present

## 2016-11-15 DIAGNOSIS — K219 Gastro-esophageal reflux disease without esophagitis: Secondary | ICD-10-CM

## 2016-11-15 DIAGNOSIS — C679 Malignant neoplasm of bladder, unspecified: Secondary | ICD-10-CM

## 2016-11-15 LAB — COMPREHENSIVE METABOLIC PANEL
ALBUMIN: 4.5 g/dL (ref 3.5–5.2)
ALK PHOS: 94 U/L (ref 39–117)
ALT: 26 U/L (ref 0–53)
AST: 26 U/L (ref 0–37)
BUN: 13 mg/dL (ref 6–23)
CO2: 31 mEq/L (ref 19–32)
Calcium: 10.7 mg/dL — ABNORMAL HIGH (ref 8.4–10.5)
Chloride: 100 mEq/L (ref 96–112)
Creatinine, Ser: 0.93 mg/dL (ref 0.40–1.50)
GFR: 83.66 mL/min (ref >60.00)
Glucose, Bld: 150 mg/dL — ABNORMAL HIGH (ref 70–99)
POTASSIUM: 4 meq/L (ref 3.5–5.1)
SODIUM: 137 meq/L (ref 135–145)
TOTAL PROTEIN: 7.3 g/dL (ref 6.0–8.3)
Total Bilirubin: 0.6 mg/dL (ref 0.2–1.2)

## 2016-11-15 LAB — PSA, MEDICARE: PSA: 0.93 ng/mL (ref 0.10–4.00)

## 2016-11-15 LAB — LIPID PANEL
CHOLESTEROL: 173 mg/dL (ref 0–200)
HDL: 47.7 mg/dL (ref >39.00)
LDL Cholesterol: 90 mg/dL (ref 0–99)
NonHDL: 125.19
Total CHOL/HDL Ratio: 4
Triglycerides: 175 mg/dL — ABNORMAL HIGH (ref 0.0–149.0)
VLDL: 35 mg/dL (ref 0.0–40.0)

## 2016-11-15 MED ORDER — AMLODIPINE-OLMESARTAN 5-40 MG PO TABS: 1.0000 | ORAL_TABLET | Freq: Every day | ORAL | 3 refills | Status: DC

## 2016-11-15 MED ORDER — LANSOPRAZOLE 15 MG PO CPDR: 15.0000 mg | DELAYED_RELEASE_CAPSULE | Freq: Every day | ORAL | 3 refills | Status: DC

## 2016-11-15 MED ORDER — AMLODIPINE BESYLATE 5 MG PO TABS: 5.0000 mg | ORAL_TABLET | Freq: Every day | ORAL | 3 refills | Status: DC

## 2016-11-15 NOTE — Progress Notes (Signed)
Subjective:   Patient ID: Michael Kramer, male    DOB: 1940/12/27, 76 y.o.   MRN: [id number]  Michael Kramer is a pleasant 76 y.o. year old male who presents to clinic today with Follow-up  on 11/15/2016  HPI:  Doing well.  HTN- BP has been controlled with Azor and norvasc. Denies CP, SOB or LE edema.  No HA.  H/o bladder CA-  Sees urology yearly, Dr. Bernardo Heater. Denies any urinary complaints.  Lab Results  Component Value Date   ALT 29 07/04/2016   AST 30 07/04/2016   ALKPHOS 73 07/04/2016   BILITOT 1.1 07/04/2016   Lab Results  Component Value Date   NA 137 07/04/2016   K 4.0 07/04/2016   CL 103 07/04/2016   CO2 25 07/04/2016   Lab Results  Component Value Date   WBC 9.2 07/04/2016   HGB 15.4 07/04/2016   HCT 43.7 07/04/2016   MCV 89.0 07/04/2016   PLT 156 07/04/2016    Current Outpatient Prescriptions on File Prior to Visit  Medication Sig Dispense Refill  . acetaminophen (TYLENOL) 500 MG tablet Take 500 mg by mouth every 6 (six) hours as needed. pain    . fish oil-omega-3 fatty acids 1000 MG capsule Take 1,200 mg by mouth daily.    . Multiple Vitamins-Minerals (MULTIVITAMIN WITH MINERALS) tablet Take 1 tablet by mouth daily.    . vitamin C (ASCORBIC ACID) 500 MG tablet Take 500 mg by mouth daily.    Marland Kitchen triamcinolone cream (KENALOG) 0.1 % Apply 1 application topically 2 (two) times daily as needed. (Patient not taking: Reported on 11/15/2016) 45 g 1   No current facility-administered medications on file prior to visit.     No Known Allergies  Past Medical History:  Diagnosis Date  . Allergy   . Anxiety    white coat syndrome  . Arthritis   . BPH (benign prostatic hypertrophy)   . Cancer ([place])    bladder  . GERD (gastroesophageal reflux disease)   . Hypertension   . Shingles     Past Surgical History:  Procedure Laterality Date  . bladder cancer  10/2006   treated with TB Virus  . cartoid     normal  . CHOLECYSTECTOMY  1982  . COLONOSCOPY      . DOPPLER ECHOCARDIOGRAPHY  1997   normal  . ELECTROCARDIOGRAM    . holter moniter     normal  . POLYPECTOMY    . TONSILLECTOMY  1948   age 7 -65  . TRANSURETHRAL RESECTION OF PROSTATE      Family History  Problem Relation Age of Onset  . Emphysema Mother     smoker  . Hypertension Paternal Grandfather   . Colon cancer Neg Hx   . Colon polyps Neg Hx   . Esophageal cancer Neg Hx   . Rectal cancer Neg Hx   . Stomach cancer Neg Hx     Social History   Social History  . Marital status: Married    Spouse name: N/A  . Number of children: 4  . Years of education: N/A   Occupational History  . retired Herbalist    Social History Main Topics  . Smoking status: Former Smoker    Types: Cigarettes    Quit date: 01/20/1987  . Smokeless tobacco: Never Used  . Alcohol use No  . Drug use: No  . Sexual activity: No   Other Topics Concern  . Not on file  Social History Narrative   Hobbies: golf and sports   Regular exercise--yes golf 3 times a week   Sleeps 8-9 hours per night         The PMH, PSH, Social History, Family History, Medications, and allergies have been reviewed in [facility], and have been updated if relevant.   Review of Systems  Constitutional: Negative.   HENT: Negative.   Eyes: Negative.   Respiratory: Negative.   Cardiovascular: Negative.   Gastrointestinal: Negative.   Genitourinary: Negative.   Musculoskeletal: Negative.   Neurological: Negative.   Hematological: Negative.   Psychiatric/Behavioral: Negative.   All other systems reviewed and are negative.      Objective:    BP 124/80 (BP Location: Right Arm, Patient Position: Sitting, Cuff Size: Normal)   Pulse 91   Temp 97.8 F (36.6 C) (Oral)   Ht 5' 7.5" (1.715 m) Comment: no shoes  Wt 147 lb 8 oz (66.9 kg)   SpO2 96%   BMI 22.76 kg/m    Physical Exam  Constitutional: He is oriented to person, place, and time. He appears well-developed and well-nourished. No distress.  HENT:   Head: Normocephalic and atraumatic.  Cardiovascular: Normal rate and regular rhythm.   Pulmonary/Chest: Effort normal and breath sounds normal.  Musculoskeletal: Normal range of motion. He exhibits no edema.  Neurological: He is alert and oriented to person, place, and time. He displays normal reflexes. No cranial nerve deficit. Coordination normal.  Skin: Skin is warm and dry. He is not diaphoretic.  Psychiatric: He has a normal mood and affect. His behavior is normal. Judgment and thought content normal.  Nursing note and vitals reviewed.         Assessment & Plan:   HYPERTENSION, BENIGN ESSENTIAL - Plan: Comprehensive metabolic panel, Lipid panel  Malignant neoplasm of urinary bladder, unspecified site [facility])  Screening for prostate cancer - Plan: PSA, Medicare No Follow-up on file.

## 2016-11-15 NOTE — Progress Notes (Signed)
PCP notes:   Health maintenance:  PCV13 - administered  Abnormal screenings:   Hearing -failed  Patient concerns:   Pt is concerned about blood pressure.  Nurse concerns:  None  Next PCP appt:   11/15/16 @ 1145

## 2016-11-15 NOTE — Assessment & Plan Note (Signed)
Well controlled. No changes made today. eRx refills sent to mail order pharmacy.

## 2016-11-15 NOTE — Progress Notes (Signed)
Subjective:   Michael Kramer is a 76 y.o. male who presents for an Initial Medicare Annual Wellness Visit.  Review of Systems  N/A Cardiac Risk Factors include: advanced age (>70men, >24 women);male gender;dyslipidemia;hypertension    Objective:    Today's Vitals   11/15/16 1123  BP: 124/80  Pulse: 91  Temp: 97.8 F (36.6 C)  TempSrc: Oral  SpO2: 96%  Weight: 147 lb 8 oz (66.9 kg)  Height: 5' 7.5" (1.715 m)  PainSc: 0-No pain   Body mass index is 22.76 kg/m.  Current Medications (verified) Outpatient Encounter Prescriptions as of 11/15/2016  Medication Sig  . acetaminophen (TYLENOL) 500 MG tablet Take 500 mg by mouth every 6 (six) hours as needed. pain  . amLODipine (NORVASC) 5 MG tablet Take 1 tablet (5 mg total) by mouth daily.  Marland Kitchen amLODipine-olmesartan (AZOR) 5-40 MG tablet Take 1 tablet by mouth daily.  . fish oil-omega-3 fatty acids 1000 MG capsule Take 1,200 mg by mouth daily.  . lansoprazole (PREVACID) 15 MG capsule Take 1 capsule (15 mg total) by mouth daily.  . Multiple Vitamins-Minerals (MULTIVITAMIN WITH MINERALS) tablet Take 1 tablet by mouth daily.  . vitamin C (ASCORBIC ACID) 500 MG tablet Take 500 mg by mouth daily.  Marland Kitchen triamcinolone cream (KENALOG) 0.1 % Apply 1 application topically 2 (two) times daily as needed. (Patient not taking: Reported on 11/15/2016)  . [DISCONTINUED] predniSONE (DELTASONE) 20 MG tablet Take 2 tablets (40 mg total) by mouth daily. For 3 days, then 1 tab daily for 3 days.   No facility-administered encounter medications on file as of 11/15/2016.     Allergies (verified) Patient has no known allergies.   History: Past Medical History:  Diagnosis Date  . Allergy   . Anxiety    white coat syndrome  . Arthritis   . BPH (benign prostatic hypertrophy)   . Cancer ([place])    bladder  . GERD (gastroesophageal reflux disease)   . Hypertension   . Shingles    Past Surgical History:  Procedure Laterality Date  . bladder cancer   10/2006   treated with TB Virus  . cartoid     normal  . CHOLECYSTECTOMY  1982  . COLONOSCOPY    . DOPPLER ECHOCARDIOGRAPHY  1997   normal  . ELECTROCARDIOGRAM    . holter moniter     normal  . POLYPECTOMY    . TONSILLECTOMY  1948   age 53 -9  . TRANSURETHRAL RESECTION OF PROSTATE     Family History  Problem Relation Age of Onset  . Emphysema Mother     smoker  . Hypertension Paternal Grandfather   . Colon cancer Neg Hx   . Colon polyps Neg Hx   . Esophageal cancer Neg Hx   . Rectal cancer Neg Hx   . 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 Smoking status: Former Smoker    Types: Cigarettes    Quit date: 01/20/1987  . Smokeless tobacco: Never Used  . Alcohol use No  . Drug use: No  . Sexual activity: No   Tobacco Counseling Counseling given: No   Activities of Daily Living In your present state of health, do you have any difficulty performing the following activities: 11/15/2016  Hearing? N  Vision? N  Difficulty concentrating or making decisions? N  Walking or climbing stairs? N  Dressing or bathing? N  Doing errands, shopping? N  Preparing Food and eating ? N  Using the Toilet? N  In the past six months, have you accidently leaked urine? N  Do you have problems with loss of bowel control? N  Managing your Medications? N  Managing your Finances? N  Housekeeping or managing your Housekeeping? N  Some recent data might be hidden    Immunizations and Health Maintenance Immunization History  Administered Date(s) Administered  . Influenza Split 07/06/2011  . Influenza Whole 07/08/2009, 05/12/2010  . Influenza, Seasonal, Injecte, Preservative Fre 07/17/2012, 05/12/2016  . Influenza-Unspecified 06/17/2013, 06/08/2014  . Pneumococcal Polysaccharide-23 11/05/2008  . Td 08/21/1994, 10/29/2007  . Zoster 08/15/2012   There are no preventive care reminders to display for this  patient.  Patient Care Team: Lucille Passy, MD as PCP - General     Assessment:   This is a routine wellness examination for Michael Kramer.  Hearing/Vision screen  Hearing Screening   125Hz  250Hz  500Hz  1000Hz  2000Hz  3000Hz  4000Hz  6000Hz  8000Hz   Right ear:   40 40 40  0    Left ear:   40 40 0  0      Visual Acuity Screening   Right eye Left eye Both eyes  Without correction:     With correction: 20/20 20/20 20/25   Comments: Last vision exam in 2016   Dietary issues and exercise activities discussed: Current Exercise Habits: Home exercise routine, Type of exercise: walking;Other - see comments (golf), Time (Minutes): 15, Frequency (Times/Week): 3, Weekly Exercise (Minutes/Week): 45, Intensity: Mild, Exercise limited by: None identified  Goals    . Increase physical activity          Starting 11/15/2016, I will continue to walk at least 10-15 min 3-4 days per week and when weather permits, to resume playing golf.       Depression Screen PHQ 2/9 Scores 11/15/2016  PHQ - 2 Score 0    Fall Risk Fall Risk  11/15/2016  Falls in the past year? No    Cognitive Function: MMSE - Mini Mental State Exam 11/15/2016  Orientation to time 5  Orientation to Place 5  Registration 3  Attention/ Calculation 0  Recall 3  Language- name 2 objects 0  Language- repeat 1  Language- follow 3 step command 3  Language- read & follow direction 0  Write a sentence 0  Copy design 0  Total score 20       PLEASE NOTE: A Mini-Cog screen was completed. Maximum score is 20. A value of 0 denotes this part of [place] MMSE was not completed or the patient failed this part of the Mini-Cog screening.   Mini-Cog Screening Orientation to Time - Max 5 pts Orientation to Place - Max 5 pts Registration - Max 3 pts Recall - Max 3 pts Language Repeat - Max 1 pts Language Follow 3 Step Command - Max 3 pts  Screening Tests Health Maintenance  Topic Date Due  . TETANUS/TDAP  10/28/2017  . COLONOSCOPY   08/25/2018  . INFLUENZA VACCINE  Completed  . PNA vac Low Risk Adult  Completed        Plan:     I have personally reviewed and addressed the Medicare Annual Wellness questionnaire and have noted the following in the patient's chart:  A. Medical and social history B. Use of alcohol, tobacco or illicit drugs  C. Current medications and supplements D. Functional ability and status E.  Nutritional status F.  Physical activity G. Advance directives H.  List of other physicians I.  Hospitalizations, surgeries, and ER visits in previous 12 months J.  [place] to include hearing, vision, cognitive, depression L. Referrals and appointments - none  In addition, I have reviewed and discussed with patient certain preventive protocols, quality metrics, and best practice recommendations. A written personalized care plan for preventive services as well as general preventive health recommendations were provided to patient.  See attached scanned questionnaire for additional information.   Signed,   Lindell Noe, MHA, BS, LPN Health Coach

## 2016-11-15 NOTE — Patient Instructions (Signed)
Michael Kramer , Thank you for taking time to come for your Medicare Wellness Visit. I appreciate your ongoing commitment to your health goals. Please review the following plan we discussed and let me know if I can assist you in the future.   These are the goals we discussed: Goals    . Increase physical activity          Starting 11/15/2016, I will continue to walk at least 10-15 min 3-4 days per week and when weather permits, to resume playing golf.        This is a list of the screening recommended for you and due dates:  Health Maintenance  Topic Date Due  . Tetanus Vaccine  10/28/2017  . Colon Cancer Screening  08/25/2018  . Flu Shot  Completed  . Pneumonia vaccines  Completed   Preventive Care for Adults  A healthy lifestyle and preventive care can promote health and wellness. Preventive health guidelines for adults include the following key practices.  . A routine yearly physical is a good way to check with your health care provider about your health and preventive screening. It is a chance to share any concerns and updates on your health and to receive a thorough exam.  . Visit your dentist for a routine exam and preventive care every 6 months. Brush your teeth twice a day and floss once a day. Good oral hygiene prevents tooth decay and gum disease.  . The frequency of eye exams is based on your age, health, family medical history, use  of contact lenses, and other factors. Follow your health care provider's ecommendations for frequency of eye exams.  . Eat a healthy diet. Foods like vegetables, fruits, whole grains, low-fat dairy products, and lean protein foods contain the nutrients you need without too many calories. Decrease your intake of foods high in solid fats, added sugars, and salt. Eat the right amount of calories for you. Get information about a proper diet from your health care provider, if necessary.  . Regular physical exercise is one of the most important things  you can do for your health. Most adults should get at least 150 minutes of moderate-intensity exercise (any activity that increases your heart rate and causes you to sweat) each week. In addition, most adults need muscle-strengthening exercises on 2 or more days a week.  Silver Sneakers may be a benefit available to you. To determine eligibility, you may visit the website: www.silversneakers.com or contact program at 519-639-0741 Mon-Fri between 8AM-8PM.   . Maintain a healthy weight. The body mass index (BMI) is a screening tool to identify possible weight problems. It provides an estimate of body fat based on height and weight. Your health care provider can find your BMI and can help you achieve or maintain a healthy weight.   For adults 20 years and older: ? A BMI below 18.5 is considered underweight. ? A BMI of 18.5 to 24.9 is normal. ? A BMI of 25 to 29.9 is considered overweight. ? A BMI of 30 and above is considered obese.   . Maintain normal blood lipids and cholesterol levels by exercising and minimizing your intake of saturated fat. Eat a balanced diet with plenty of fruit and vegetables. Blood tests for lipids and cholesterol should begin at age 59 and be repeated every 5 years. If your lipid or cholesterol levels are high, you are over 50, or you are at high risk for heart disease, you may need your cholesterol levels checked  more frequently. Ongoing high lipid and cholesterol levels should be treated with medicines if diet and exercise are not working.  . If you smoke, find out from your health care provider how to quit. If you do not use tobacco, please do not start.  . If you choose to drink alcohol, please do not consume more than 2 drinks per day. One drink is considered to be 12 ounces (355 mL) of beer, 5 ounces (148 mL) of wine, or 1.5 ounces (44 mL) of liquor.  . If you are 52-37 years old, ask your health care provider if you should take aspirin to prevent strokes.  . Use  sunscreen. Apply sunscreen liberally and repeatedly throughout the day. You should seek shade when your shadow is shorter than you. Protect yourself by wearing long sleeves, pants, a wide-brimmed hat, and sunglasses year round, whenever you are outdoors.  . Once a month, do a whole body skin exam, using a mirror to look at the skin on your back. Tell your health care provider of new moles, moles that have irregular borders, moles that are larger than a pencil eraser, or moles that have changed in shape or color.

## 2016-11-15 NOTE — Assessment & Plan Note (Signed)
Followed by urology. Yearly surveillance with cystoscopy in 04/2017.

## 2016-11-15 NOTE — Progress Notes (Signed)
Pre visit review using our clinic review tool, if applicable. No additional management support is needed unless otherwise documented below in the visit note. 

## 2016-11-15 NOTE — Assessment & Plan Note (Signed)
Quiet on PPI. eRx refills sent.

## 2016-11-15 NOTE — Progress Notes (Signed)
I reviewed health advisor's note, was available for consultation, and agree with documentation and plan.  

## 2016-11-15 NOTE — Patient Instructions (Signed)
Great to see you. We will call you with your results from today. 

## 2016-11-21 NOTE — Telephone Encounter (Signed)
Seen [phone] 

## 2017-05-07 ENCOUNTER — Ambulatory Visit (INDEPENDENT_AMBULATORY_CARE_PROVIDER_SITE_OTHER): Payer: Medicare PPO | Admitting: Family Medicine

## 2017-05-07 ENCOUNTER — Encounter: Payer: Self-pay | Admitting: Family Medicine

## 2017-05-07 DIAGNOSIS — R21 Rash and other nonspecific skin eruption: Secondary | ICD-10-CM | POA: Diagnosis not present

## 2017-05-07 DIAGNOSIS — H25013 Cortical age-related cataract, bilateral: Secondary | ICD-10-CM | POA: Diagnosis not present

## 2017-05-07 DIAGNOSIS — H02834 Dermatochalasis of left upper eyelid: Secondary | ICD-10-CM | POA: Diagnosis not present

## 2017-05-07 DIAGNOSIS — H2513 Age-related nuclear cataract, bilateral: Secondary | ICD-10-CM | POA: Diagnosis not present

## 2017-05-07 DIAGNOSIS — H02831 Dermatochalasis of right upper eyelid: Secondary | ICD-10-CM | POA: Diagnosis not present

## 2017-05-07 MED ORDER — FLUOCINONIDE 0.05 % EX SOLN: 1.0000 "application " | Freq: Two times a day (BID) | CUTANEOUS | 0 refills | Status: DC

## 2017-05-07 NOTE — Assessment & Plan Note (Signed)
Seems consistent with allergic/plant dermatitis. Will treat with lidex twice daily for no more than 10 days without follow up. Wash clothes that could have come in contact with this. He has no pets. Call or return to clinic prn if these symptoms worsen or fail to improve as anticipated. The patient indicates understanding of these issues and agrees with the plan.

## 2017-05-07 NOTE — Patient Instructions (Signed)
Great to see you. Please apply lidex twice daily for 10 days maximum without updating me.  Please make an appointment with an eye doctor.

## 2017-05-07 NOTE — Progress Notes (Signed)
Subjective:   Patient ID: Michael Kramer, male    DOB: 1941/01/10, 76 y.o.   MRN: [id number]  Michael Kramer is a pleasant 76 y.o. year 76 y.o. year old male who presents to clinic today with rash on arms  on 05/07/2017  HPI:  Rash - on left arm.  Very itchy, does feel it is larger but has not spread to other parts of his body.  Benadryl has helped with itchy.  No fluid filled blisters.  Not painful.  Was out side.  He is allergic to some plants.  No changes in soaps or detergents.   Current Outpatient Prescriptions on File Prior to Visit  Medication Sig Dispense Refill  . acetaminophen (TYLENOL) 500 MG tablet Take 500 mg by mouth every 6 (six) hours as needed. pain    . amLODipine (NORVASC) 5 MG tablet Take 1 tablet (5 mg total) by mouth daily. 90 tablet 3  . amLODipine-olmesartan (AZOR) 5-40 MG tablet Take 1 tablet by mouth daily. 90 tablet 3  . fish oil-omega-3 fatty acids 1000 MG capsule Take 1,200 mg by mouth daily.    . lansoprazole (PREVACID) 15 MG capsule Take 1 capsule (15 mg total) by mouth daily. 90 capsule 3  . Multiple Vitamins-Minerals (MULTIVITAMIN WITH MINERALS) tablet Take 1 tablet by mouth daily.    Marland Kitchen triamcinolone cream (KENALOG) 0.1 % Apply 1 application topically 2 (two) times daily as needed. (Patient not taking: Reported on 05/07/2017) 45 g 1   No current facility-administered medications on file prior to visit.     No Known Allergies  Past Medical History:  Diagnosis Date  . Allergy   . Anxiety    white coat syndrome  . Arthritis   . BPH (benign prostatic hypertrophy)   . Cancer ([place])    bladder  . GERD (gastroesophageal reflux disease)   . Hypertension   . Shingles     Past Surgical History:  Procedure Laterality Date  . bladder cancer  10/2006   treated with TB Virus  . cartoid     normal  . CHOLECYSTECTOMY  1982  . COLONOSCOPY    . DOPPLER ECHOCARDIOGRAPHY  1997   normal  . ELECTROCARDIOGRAM    . holter moniter     normal  . POLYPECTOMY     . TONSILLECTOMY  1948   age 10 -36  . TRANSURETHRAL RESECTION OF PROSTATE      Family History  Problem Relation Age of Onset  . Emphysema Mother        smoker  . Hypertension Paternal Grandfather   . Colon cancer Neg Hx   . Colon polyps Neg Hx   . Esophageal cancer Neg Hx   . Rectal cancer Neg Hx   . Stomach cancer Neg Hx     Social History   Social History  . Marital status: Married    Spouse name: N/A  . Number of children: 4  . Years of education: N/A   Occupational History  . retired Herbalist    Social History Main Topics  . Smoking status: Former Smoker    Types: Cigarettes    Quit date: 01/20/1987  . Smokeless tobacco: Never Used  . Alcohol use No  . Drug use: No  . Sexual activity: No   Other Topics Concern  . Not on file   Social History Narrative   Hobbies: golf and sports   Regular exercise--yes golf 3 times a week   Sleeps 8-9 hours per night  The PMH, PSH, Social History, Family History, Medications, and allergies have been reviewed in [facility], and have been updated if relevant.   Review of Systems  Skin: Positive for rash.  All other systems reviewed and are negative.      Objective:    BP 130/70   Pulse 92   Temp 97.6 F (36.4 C) (Oral)   Wt 148 lb (67.1 kg)   BMI 22.84 kg/m    Physical Exam  Constitutional: He is oriented to person, place, and time. He appears well-developed and well-nourished. No distress.  HENT:  Head: Normocephalic and atraumatic.  Eyes: Conjunctivae are normal.  Cardiovascular: Normal rate.   Pulmonary/Chest: Effort normal.  Musculoskeletal: Normal range of motion.  Neurological: He is alert and oriented to person, place, and time. No cranial nerve deficit.  Skin: He is not diaphoretic.     Psychiatric: He has a normal mood and affect. His behavior is normal. Judgment and thought content normal.  Nursing note and vitals reviewed.         Assessment & Plan:   Rash and nonspecific skin  eruption No Follow-up on file.

## 2017-05-17 DIAGNOSIS — H02834 Dermatochalasis of left upper eyelid: Secondary | ICD-10-CM | POA: Diagnosis not present

## 2017-05-17 DIAGNOSIS — H524 Presbyopia: Secondary | ICD-10-CM | POA: Diagnosis not present

## 2017-05-17 DIAGNOSIS — H2513 Age-related nuclear cataract, bilateral: Secondary | ICD-10-CM | POA: Diagnosis not present

## 2017-05-17 DIAGNOSIS — H02831 Dermatochalasis of right upper eyelid: Secondary | ICD-10-CM | POA: Diagnosis not present

## 2017-05-17 DIAGNOSIS — H25013 Cortical age-related cataract, bilateral: Secondary | ICD-10-CM | POA: Diagnosis not present

## 2017-05-21 ENCOUNTER — Telehealth: Payer: Self-pay

## 2017-05-21 NOTE — Telephone Encounter (Signed)
Great news. Thanks for the update. 

## 2017-05-21 NOTE — Telephone Encounter (Signed)
Pt left v/m; pt seen 05/07/17 and pt was to cb with update; pt rash is much better and pt went to eye doctor and eyes checked out fine. FYI to Dr Deborra Medina.

## 2017-05-23 DIAGNOSIS — D414 Neoplasm of uncertain behavior of bladder: Secondary | ICD-10-CM | POA: Diagnosis not present

## 2017-05-23 DIAGNOSIS — Z859 Personal history of malignant neoplasm, unspecified: Secondary | ICD-10-CM | POA: Diagnosis not present

## 2017-05-23 DIAGNOSIS — N138 Other obstructive and reflux uropathy: Secondary | ICD-10-CM | POA: Diagnosis not present

## 2017-05-23 DIAGNOSIS — N401 Enlarged prostate with lower urinary tract symptoms: Secondary | ICD-10-CM | POA: Diagnosis not present

## 2017-05-23 DIAGNOSIS — Z8551 Personal history of malignant neoplasm of bladder: Secondary | ICD-10-CM | POA: Diagnosis not present

## 2017-07-25 ENCOUNTER — Encounter: Payer: Self-pay | Admitting: Nurse Practitioner

## 2017-07-25 ENCOUNTER — Encounter (INDEPENDENT_AMBULATORY_CARE_PROVIDER_SITE_OTHER): Payer: Self-pay

## 2017-07-25 ENCOUNTER — Ambulatory Visit: Payer: Medicare PPO | Admitting: Nurse Practitioner

## 2017-07-25 VITALS — BP 128/64 | HR 85 | Ht 68.0 in | Wt 144.0 lb

## 2017-07-25 DIAGNOSIS — K5900 Constipation, unspecified: Secondary | ICD-10-CM | POA: Diagnosis not present

## 2017-07-25 DIAGNOSIS — K645 Perianal venous thrombosis: Secondary | ICD-10-CM

## 2017-07-25 MED ORDER — HYDROCORTISONE 2.5 % RE CREA: 1.0000 "application " | TOPICAL_CREAM | Freq: Two times a day (BID) | RECTAL | 0 refills | Status: DC

## 2017-07-25 NOTE — Progress Notes (Signed)
Agree with assessment and plan as outlined.  

## 2017-07-25 NOTE — Patient Instructions (Addendum)
If you are age 78 or older, your body mass index should be between 23-30. Your Body mass index is 21.9 kg/m. If this is out of the aforementioned range listed, please consider follow up with your Primary Care Provider.  If you are age 46 or younger, your body mass index should be between 19-25. Your Body mass index is 21.9 kg/m. If this is out of the aformentioned range listed, please consider follow up with your Primary Care Provider.   We have sent the following medications to your pharmacy for you to pick up at your convenience: Anusol cream  Continue Fiber and stool softener.  Follow up with Tye Savoy, NP on August 02, 2017 at 9:30 am.  Thank you for choosing me and [facility].   Tye Savoy, NP

## 2017-07-25 NOTE — Progress Notes (Signed)
Chief Complaint:  Rectal bleeding  HPI: Patient is a 76 year old male known to Dr. Havery Moros. He has history of adenomatous colon polyps, diverticulosis and internal hemorrhoids. Year ago patient went to the emergency department with rectal bleeding. He was diagnosed with hemorrhoids, told to start fiber which he did and had no further problems with constipation or bleeding. Several months ago patient says he did a stupid thing and stopped his fiber. He became constipated again and since Thanksgiving has been having recurrent rectal bleeding. He has noticed a very large hemorrhoid for which he has been using Preparation H. The hemorrhoid has gotten smaller, he's not having any anorectal pain. No abdominal pain.  No other GI complaints. Her general medical complaints Surveillance colonoscopy January 2017- 3 mm sessile polyp removed from the transverse colon. 5 mm sessile polyp removed from the descending colon. A 6 mm pedunculated polyp was removed from the sigmoid colon. Mild diverticulosis noted in the sigmoid and ascending colon. Internal hemorrhoids found. Path c/w with tubular adenoma without HGD.    Past Medical History:  Diagnosis Date  . Allergy   . Anxiety    white coat syndrome  . Arthritis   . BPH (benign prostatic hypertrophy)   . Cancer ([place])    bladder  . GERD (gastroesophageal reflux disease)   . Hypertension   . Shingles     Family History  Problem Relation Age of Onset  . Emphysema Mother        smoker  . Hypertension Paternal Grandfather   . Colon cancer Neg Hx   . Colon polyps Neg Hx   . Esophageal cancer Neg Hx   . Rectal cancer Neg Hx   . Stomach cancer Neg Hx    Social History   Tobacco Use  . Smoking status: Former Smoker    Types: Cigarettes    Last attempt to quit: 01/20/1987    Years since quitting: 30.5  . Smokeless tobacco: Never Used  Substance Use Topics  . Alcohol use: No    Alcohol/week: 0.0 oz  . Drug use: No   Current Outpatient  Medications  Medication Sig Dispense Refill  . acetaminophen (TYLENOL) 500 MG tablet Take 500 mg by mouth every 6 (six) hours as needed. pain    . amLODipine (NORVASC) 5 MG tablet Take 1 tablet (5 mg total) by mouth daily. 90 tablet 3  . amLODipine-olmesartan (AZOR) 5-40 MG tablet Take 1 tablet by mouth daily. 90 tablet 3  . Docusate Calcium (STOOL SOFTENER PO) Take by mouth.    . fish oil-omega-3 fatty acids 1000 MG capsule Take 1,200 mg by mouth daily.    . lansoprazole (PREVACID) 15 MG capsule Take 1 capsule (15 mg total) by mouth daily. 90 capsule 3  . Multiple Vitamins-Minerals (MULTIVITAMIN WITH MINERALS) tablet Take 1 tablet by mouth daily.     No current facility-administered medications for this visit.    No Known Allergies   Physical Exam: BP 128/64   Pulse 85   Ht 5\' 8"  (1.727 m)   Wt 144 lb (65.3 kg)   BMI 21.90 kg/m  Constitutional:  Well-developed, white male in no acute distress. Psychiatric: Normal mood and affect. Behavior is normal. Pulmonary/chest: Effort normal  Abdominal: Soft, nondistended. Nontender. Bowel sounds active throughout. There are no masses palpable. No hepatomegaly. Rectal: large thrombosed external hemorrhoid Neurological: Alert and oriented to person place and time. Skin: Skin is warm and dry. No rashes noted.   ASSESSMENT AND PLAN:  76 year old male with recurrent constipation leading to thrombosed hemorrhoid with bleeding. Using Preparation H since Thanksgiving, says that the hemorrhoid has definitely gotten smaller.  -He needs close follow-up. Will change him from Preparation H to Anusol to apply twice daily for the next 7 days -Return to see me in one week. Call in the interim if not improving -Do not strain. Continue fiber and stool softeners indefinitely  History of adenomatous colon polyps. He is on the recall list for colonoscopy January 2020  Tye Savoy, NP  07/25/2017, 9:44 AM

## 2017-08-02 ENCOUNTER — Other Ambulatory Visit: Payer: Self-pay | Admitting: Nurse Practitioner

## 2017-08-02 ENCOUNTER — Ambulatory Visit: Payer: Medicare PPO | Admitting: Nurse Practitioner

## 2017-08-02 ENCOUNTER — Encounter: Payer: Self-pay | Admitting: Nurse Practitioner

## 2017-08-02 VITALS — BP 134/66 | HR 93 | Ht 67.5 in | Wt 144.0 lb

## 2017-08-02 DIAGNOSIS — K644 Residual hemorrhoidal skin tags: Secondary | ICD-10-CM

## 2017-08-02 DIAGNOSIS — K5900 Constipation, unspecified: Secondary | ICD-10-CM

## 2017-08-02 NOTE — Progress Notes (Signed)
     Chief Complaint:  Hemorrhoidal recheck   HPI: Patient is a 76 yo male who I recently saw for a thrombosed hemorrhoid in setting of constipation. I started him on Anusol cream. He had already remedied the constipation with fiber and stool softeners by the time I saw him in clinic. Plan was to see him back and if no improvement then referral for surgical evaluation. He is here for follow up  Bleeding has continued to improve and hardly having any at all now. In fact,  none with today's BM. The hemorrhoidal continues to shrink. He has no perianal pain.   Past Medical History:  Diagnosis Date  . Allergy   . Anxiety    white coat syndrome  . Arthritis   . BPH (benign prostatic hypertrophy)   . Cancer ([place])    bladder  . GERD (gastroesophageal reflux disease)   . Hypertension   . Shingles     Patient's surgical history, family medical history, social history, medications and allergies were all reviewed in Epic    Physical Exam: BP 134/66   Pulse 93   Ht 5' 7.5" (1.715 m)   Wt 144 lb (65.3 kg)   BMI 22.22 kg/m   GENERAL:  Well developed male in NAD PSYCH: :Pleasant, cooperative, normal affect Rectal : previously seen large external hemorrhoid has improved. It has decreased in size and is certainly much softer NEURO: Alert and oriented x 3, no focal neurologic deficits   ASSESSMENT and PLAN:  Pleasant 76 year old for interval follow up on thrombosed hemorrhoid. The hemorrhoid is smaller but still large. It is definitely softer and non-tender. Continue with conservative measures for now -Continue Anusol at bedtime for another 7 days.  -avoid constipation, keep stools soft -follow up again in early January    Tabor Denham , NP 08/02/2017, 9:55 AM

## 2017-08-02 NOTE — Telephone Encounter (Signed)
Patient wife calling in stating that patient will be out of town for a couple of weeks and is requesting refill.

## 2017-08-02 NOTE — Patient Instructions (Signed)
If you are age 78 or older, your body mass index should be between 23-30. Your Body mass index is 22.22 kg/m. If this is out of the aforementioned range listed, please consider follow up with your Primary Care Provider.  If you are age 65 or younger, your body mass index should be between 19-25. Your Body mass index is 22.22 kg/m. If this is out of the aformentioned range listed, please consider follow up with your Primary Care Provider.   Continue Fiber and Stool Softener. Continue Anusol at bedtime for two weeks.  Follow up with Tye Savoy in early January.  Schedule not available at this time.  We will contact you when the schedule is available.  Thank you for choosing me and [facility].   Tye Savoy, NP

## 2017-08-03 ENCOUNTER — Encounter: Payer: Self-pay | Admitting: Nurse Practitioner

## 2017-08-06 ENCOUNTER — Encounter: Payer: Self-pay | Admitting: Nurse Practitioner

## 2017-08-06 DIAGNOSIS — D225 Melanocytic nevi of trunk: Secondary | ICD-10-CM | POA: Diagnosis not present

## 2017-08-06 DIAGNOSIS — C44321 Squamous cell carcinoma of skin of nose: Secondary | ICD-10-CM | POA: Diagnosis not present

## 2017-08-06 DIAGNOSIS — L820 Inflamed seborrheic keratosis: Secondary | ICD-10-CM | POA: Diagnosis not present

## 2017-08-06 DIAGNOSIS — D485 Neoplasm of uncertain behavior of skin: Secondary | ICD-10-CM | POA: Diagnosis not present

## 2017-08-06 DIAGNOSIS — C4441 Basal cell carcinoma of skin of scalp and neck: Secondary | ICD-10-CM | POA: Diagnosis not present

## 2017-08-06 DIAGNOSIS — D2261 Melanocytic nevi of right upper limb, including shoulder: Secondary | ICD-10-CM | POA: Diagnosis not present

## 2017-08-06 DIAGNOSIS — L538 Other specified erythematous conditions: Secondary | ICD-10-CM | POA: Diagnosis not present

## 2017-08-06 DIAGNOSIS — D2271 Melanocytic nevi of right lower limb, including hip: Secondary | ICD-10-CM | POA: Diagnosis not present

## 2017-08-06 DIAGNOSIS — L821 Other seborrheic keratosis: Secondary | ICD-10-CM | POA: Diagnosis not present

## 2017-08-07 NOTE — Progress Notes (Signed)
Agree with assessment and plan as outlined.  

## 2017-08-29 ENCOUNTER — Ambulatory Visit: Payer: Medicare PPO | Admitting: Nurse Practitioner

## 2017-08-29 ENCOUNTER — Encounter: Payer: Self-pay | Admitting: Nurse Practitioner

## 2017-08-29 VITALS — BP 130/60 | HR 82 | Ht 68.0 in | Wt 144.0 lb

## 2017-08-29 DIAGNOSIS — K5900 Constipation, unspecified: Secondary | ICD-10-CM | POA: Diagnosis not present

## 2017-08-29 DIAGNOSIS — K649 Unspecified hemorrhoids: Secondary | ICD-10-CM

## 2017-08-29 NOTE — Progress Notes (Signed)
     Chief Complaint:  Follow up on hemorrhoid  HPI: Patient is a 77 year old male who I have been recently following for constipation and a thrombosed hemorrhoid. He had a large thrombosed hemorrhoid on exam early December. I started Anusol HC topical cream. He had already corrected the constipation with stool softeners and fiber. I saw him back in follow up mid December, hemorrhoid was better but still quite large. I asked him to return today for Surgical referral if no significant improvemnt. He says the hemorrhoid has continued to shrink. He has no new complaints.    Past Medical History:  Diagnosis Date  . Allergy   . Anxiety    white coat syndrome  . Arthritis   . BPH (benign prostatic hypertrophy)   . Cancer ([place])    bladder  . GERD (gastroesophageal reflux disease)   . Hypertension   . Shingles     Patient's surgical history, family medical history, social history, medications and allergies were all reviewed in Epic    Physical Exam: BP 130/60   Pulse 82   Ht 5\' 8"  (1.727 m)   Wt 144 lb (65.3 kg)   BMI 21.90 kg/m   GENERAL:  Thin white male in NAD PSYCH: :Pleasant, cooperative, normal affect ABDOMEN:  Nondistended, soft, nontender. No obvious masses, small RLQ hernia RECTAL: previously seen thrombosed hemorrhoid has resolved  ASSESSMENT and PLAN:  1. Pleasant 77 yo male old with recent constipation and thrombosed external hemorrhoid. Both have resolved.  -continue stool softener and fiber to prevent constipation -Discontinue topical steroid cream. He inquired about using a small amount of the steroid cream for perianal dryness. Advised against this. Trial of Balneol instead.   -Call for recurrent problems   2.  History of adenomatous colon polyps.  On recall colonoscopy list for January 2020   Michael Kramer , NP 08/29/2017, 11:13 AM

## 2017-08-29 NOTE — Patient Instructions (Signed)
If you are age 79 or older, your body mass index should be between 23-30. Your Body mass index is 21.9 kg/m. If this is out of the aforementioned range listed, please consider follow up with your Primary Care Provider.  If you are age 26 or younger, your body mass index should be between 19-25. Your Body mass index is 21.9 kg/m. If this is out of the aformentioned range listed, please consider follow up with your Primary Care Provider.   Balneol as needed to anus.  Follow up as needed.  Thank you for choosing me and [facility].   Tye Savoy, NP

## 2017-09-03 NOTE — Progress Notes (Signed)
Agree with assessment and plan as outlined.  

## 2017-09-04 DIAGNOSIS — C44311 Basal cell carcinoma of skin of nose: Secondary | ICD-10-CM | POA: Diagnosis not present

## 2017-09-04 DIAGNOSIS — Z85828 Personal history of other malignant neoplasm of skin: Secondary | ICD-10-CM | POA: Diagnosis not present

## 2017-09-13 DIAGNOSIS — C4441 Basal cell carcinoma of skin of scalp and neck: Secondary | ICD-10-CM | POA: Diagnosis not present

## 2017-11-19 ENCOUNTER — Ambulatory Visit: Payer: Medicare PPO

## 2017-11-19 NOTE — Progress Notes (Signed)
Subjective:   Michael Kramer is a 77 y.o. male who presents for an Initial Medicare Annual Wellness Visit.  Review of Systems No ROS.  Medicare Wellness Visit. Additional risk factors are reflected in the social history.  Cardiac Risk Factors include: advanced age (>33men, >6 women);male gender;hypertension Sleep patterns: Sleeps about 8 hrs per day.  Naps daily.  Home Safety/Smoke Alarms: Feels safe in home. Smoke alarms in place.  Living environment; residence and Firearm Safety: Lives with wife in 1 story home.   Male:   CCS-  Due 2020   PSA-  Lab Results  Component Value Date   PSA 0.93 11/15/2016   PSA 0.77 10/23/2014   PSA 0.81 11/11/2013      Objective:    Today's Vitals   11/21/17 1116  BP: (!) 142/66  Pulse: 88  SpO2: 95%  Weight: 140 lb (63.5 kg)  Height: 5' 8.25" (1.734 m)   Body mass index is 21.13 kg/m.  Advanced Directives 11/21/2017 11/15/2016 07/04/2016 08/09/2015  Does Patient Have a Medical Advance Directive? Yes No Yes Yes  Type of Paramedic of [place];Living will - Living will [place];Living will  Copy of [place] in Chart? No - copy requested - - -    Current Medications (verified) Outpatient Encounter Medications as of 11/21/2017  Medication Sig  . acetaminophen (TYLENOL) 500 MG tablet Take 500 mg by mouth every 6 (six) hours as needed. pain  . amLODipine (NORVASC) 5 MG tablet Take 1 tablet (5 mg total) by mouth daily.  Marland Kitchen amLODipine-olmesartan (AZOR) 5-40 MG tablet Take 1 tablet by mouth daily.  Mariane Baumgarten Calcium (STOOL SOFTENER PO) Take by mouth.  . fish oil-omega-3 fatty acids 1000 MG capsule Take 1,200 mg by mouth daily.  . lansoprazole (PREVACID) 15 MG capsule Take 1 capsule (15 mg total) by mouth daily.  . Multiple Vitamins-Minerals (MULTIVITAMIN WITH MINERALS) tablet Take 1 tablet by mouth daily.  . [DISCONTINUED] PROCTOSOL HC 2.5 % rectal cream PLACE 1 APPLICATION  RECTALLY 2 (TWO) TIMES DAILY. APPLY TWO TIMES DAILY FOR 7 DAYS.   No facility-administered encounter medications on file as of 11/21/2017.     Allergies (verified) Patient has no known allergies.   History: Past Medical History:  Diagnosis Date  . Allergy   . Anxiety    white coat syndrome  . Arthritis   . BPH (benign prostatic hypertrophy)   . Cancer ([place])    bladder  . GERD (gastroesophageal reflux disease)   . Hypertension   . Shingles    Past Surgical History:  Procedure Laterality Date  . bladder cancer  10/2006   treated with TB Virus  . cartoid     normal  . CHOLECYSTECTOMY  1982  . COLONOSCOPY    . DOPPLER ECHOCARDIOGRAPHY  1997   normal  . ELECTROCARDIOGRAM    . holter moniter     normal  . POLYPECTOMY    . skin cancer removal    . TONSILLECTOMY  1948   age 69 -47  . TRANSURETHRAL RESECTION OF PROSTATE     Family History  Problem Relation Age of Onset  . Emphysema Mother        smoker  . Hypertension Paternal Grandfather   . Heart disease Brother   . Colon cancer Neg Hx   . Colon polyps Neg Hx   . Esophageal cancer Neg Hx   . Rectal cancer Neg Hx   . Stomach cancer Neg  Hx    Social History   Socioeconomic History  . Marital status: Married    Spouse name: Not on file  . Number of children: 4  . Years of education: Not on file  . Highest education level: Not on file  Occupational History  . Occupation: retired Public house manager  . Financial resource strain: Not on file  . Food insecurity:    Worry: Not on file    Inability: Not on file  . Transportation needs:    Medical: Not on file    Non-medical: Not on file  Tobacco Use  . Smoking status: Former Smoker    Types: Cigarettes    Last attempt to quit: 01/20/1987    Years since quitting: 30.8  . Smokeless tobacco: Never Used  Substance and Sexual Activity  . Alcohol use: No    Alcohol/week: 0.0 oz  . Drug use: No  . Sexual activity: Never    Partners: Female  Lifestyle  .  Physical activity:    Days per week: Not on file    Minutes per session: Not on file  . Stress: Not on file  Relationships  . Social connections:    Talks on phone: Not on file    Gets together: Not on file    Attends religious service: Not on file    Active member of club or organization: Not on file    Attends meetings of clubs or organizations: Not on file    Relationship status: Not on file  Other Topics Concern  . Not on file  Social History Narrative   Hobbies: golf and sports   Regular exercise--yes golf 3 times a week   Sleeps 8-9 hours per night         Tobacco Counseling Counseling given: Not Answered   Clinical Intake: Pain : No/denies pain  Activities of Daily Living In your present state of health, do you have any difficulty performing the following activities: 11/21/2017  Hearing? N  Vision? N  Difficulty concentrating or making decisions? N  Walking or climbing stairs? N  Dressing or bathing? N  Doing errands, shopping? N  Preparing Food and eating ? N  Using the Toilet? N  In the past six months, have you accidently leaked urine? N  Do you have problems with loss of bowel control? N  Managing your Medications? N  Managing your Finances? N  Housekeeping or managing your Housekeeping? N  Some recent data might be hidden     Immunizations and Health Maintenance Immunization History  Administered Date(s) Administered  . Influenza Split 07/06/2011  . Influenza Whole 07/08/2009, 05/12/2010  . Influenza, High Dose Seasonal PF 05/22/2017  . Influenza, Seasonal, Injecte, Preservative Fre 07/17/2012, 05/12/2016  . Influenza-Unspecified 06/17/2013, 06/08/2014, 05/22/2017  . Pneumococcal Conjugate-13 11/15/2016  . Pneumococcal Polysaccharide-23 11/05/2008  . Td 08/21/1994, 10/29/2007  . Zoster 08/15/2012   Health Maintenance Due  Topic Date Due  . Samul Dada  10/28/2017    Patient Care Team: Lucille Passy, MD as PCP - General Bernardo Heater, Ronda Fairly, MD  (Urology)  Indicate any recent Medical Services you may have received from other than [facility] providers in the past year (date may be approximate).    Assessment:   This is a routine wellness examination for Shanon. Physical assessment deferred to PCP.   Hearing/Vision screen Hearing Screening Comments: Able to hear conversational tones w/o difficulty. No issues reported.   Vision Screening Comments: Per pt -recently had eye exam.  Dietary issues and exercise activities discussed: Current Exercise Habits: The patient does not participate in regular exercise at present, Exercise limited by: None identified Diet (meal preparation, eat out, water intake, caffeinated beverages, dairy products, fruits and vegetables): in general, a "healthy" diet  , well balanced  Goals    . Patient Stated     Maintain current healthy lifestyle.      Depression Screen PHQ 2/9 Scores 11/21/2017 11/21/2017 11/15/2016  PHQ - 2 Score 0 0 0    Fall Risk Fall Risk  11/21/2017 11/21/2017 11/15/2016  Falls in the past year? No No No    Cognitive Function: MMSE - Mini Mental State Exam 11/21/2017 11/15/2016  Orientation to time 5 5  Orientation to Place 5 5  Registration 3 3  Attention/ Calculation 5 0  Recall 3 3  Language- name 2 objects 2 0  Language- repeat 1 1  Language- follow 3 step command 3 3  Language- read & follow direction 1 0  Write a sentence 1 0  Copy design 1 0  Total score 30 20        Screening Tests Health Maintenance  Topic Date Due  . TETANUS/TDAP  10/28/2017  . INFLUENZA VACCINE  03/21/2018  . COLONOSCOPY  08/25/2018  . PNA vac Low Risk Adult  Completed       Plan:   Follow up with PCP as directed  Continue to eat heart healthy diet (full of fruits, vegetables, whole grains, lean protein, water--limit salt, fat, and sugar intake) and increase physical activity as tolerated.   Continue doing brain stimulating activities (puzzles, reading, adult coloring books, staying active)  to keep memory sharp.   Bring a copy of your living will and/or healthcare power of attorney to your next office visit.    I have personally reviewed and noted the following in the patient's chart:   . Medical and social history . Use of alcohol, tobacco or illicit drugs  . Current medications and supplements . Functional ability and status . Nutritional status . Physical activity . Advanced directives . List of other physicians . Hospitalizations, surgeries, and ER visits in previous 12 months . Vitals . Screenings to include cognitive, depression, and falls . Referrals and appointments  In addition, I have reviewed and discussed with patient certain preventive protocols, quality metrics, and best practice recommendations. A written personalized care plan for preventive services as well as general preventive health recommendations were provided to patient.     Shela Nevin, [place]   11/21/2017

## 2017-11-21 ENCOUNTER — Telehealth: Payer: Self-pay

## 2017-11-21 ENCOUNTER — Ambulatory Visit (INDEPENDENT_AMBULATORY_CARE_PROVIDER_SITE_OTHER): Payer: Medicare PPO | Admitting: Family Medicine

## 2017-11-21 ENCOUNTER — Other Ambulatory Visit: Payer: Self-pay

## 2017-11-21 ENCOUNTER — Encounter: Payer: Self-pay | Admitting: Behavioral Health

## 2017-11-21 ENCOUNTER — Encounter: Payer: Self-pay | Admitting: Family Medicine

## 2017-11-21 ENCOUNTER — Other Ambulatory Visit: Payer: Medicare PPO

## 2017-11-21 ENCOUNTER — Ambulatory Visit (INDEPENDENT_AMBULATORY_CARE_PROVIDER_SITE_OTHER): Payer: Medicare PPO | Admitting: Behavioral Health

## 2017-11-21 VITALS — BP 142/66 | HR 88 | Ht 68.25 in | Wt 140.0 lb

## 2017-11-21 VITALS — BP 142/66 | HR 88 | Temp 98.0°F | Ht 68.25 in | Wt 140.0 lb

## 2017-11-21 DIAGNOSIS — I10 Essential (primary) hypertension: Secondary | ICD-10-CM

## 2017-11-21 DIAGNOSIS — Z125 Encounter for screening for malignant neoplasm of prostate: Secondary | ICD-10-CM | POA: Diagnosis not present

## 2017-11-21 DIAGNOSIS — E7800 Pure hypercholesterolemia, unspecified: Secondary | ICD-10-CM | POA: Diagnosis not present

## 2017-11-21 DIAGNOSIS — Z0000 Encounter for general adult medical examination without abnormal findings: Secondary | ICD-10-CM | POA: Diagnosis not present

## 2017-11-21 DIAGNOSIS — Z8551 Personal history of malignant neoplasm of bladder: Secondary | ICD-10-CM | POA: Diagnosis not present

## 2017-11-21 LAB — COMPREHENSIVE METABOLIC PANEL
ALBUMIN: 4.2 g/dL (ref 3.5–5.2)
ALT: 26 U/L (ref 0–53)
AST: 28 U/L (ref 0–37)
Alkaline Phosphatase: 101 U/L (ref 39–117)
BUN: 13 mg/dL (ref 6–23)
CO2: 31 mEq/L (ref 19–32)
Calcium: 10.9 mg/dL — ABNORMAL HIGH (ref 8.4–10.5)
Chloride: 102 mEq/L (ref 96–112)
Creatinine, Ser: 0.82 mg/dL (ref 0.40–1.50)
GFR: 96.48 mL/min (ref >60.00)
Glucose, Bld: 119 mg/dL — ABNORMAL HIGH (ref 70–99)
POTASSIUM: 5.2 meq/L — AB (ref 3.5–5.1)
Sodium: 140 mEq/L (ref 135–145)
Total Bilirubin: 0.8 mg/dL (ref 0.2–1.2)
Total Protein: 7.3 g/dL (ref 6.0–8.3)

## 2017-11-21 LAB — LIPID PANEL
CHOLESTEROL: 171 mg/dL (ref 0–200)
HDL: 53.8 mg/dL (ref >39.00)
LDL Cholesterol: 90 mg/dL (ref 0–99)
NonHDL: 117.55
TRIGLYCERIDES: 136 mg/dL (ref 0.0–149.0)
Total CHOL/HDL Ratio: 3
VLDL: 27.2 mg/dL (ref 0.0–40.0)

## 2017-11-21 LAB — PSA, MEDICARE: PSA: 0.92 ng/ml (ref 0.10–4.00)

## 2017-11-21 MED ORDER — AMLODIPINE-OLMESARTAN 5-40 MG PO TABS: 1.0000 | ORAL_TABLET | Freq: Every day | ORAL | 3 refills | Status: DC

## 2017-11-21 MED ORDER — LANSOPRAZOLE 15 MG PO CPDR: 15.0000 mg | DELAYED_RELEASE_CAPSULE | Freq: Every day | ORAL | 3 refills | Status: DC

## 2017-11-21 MED ORDER — AMLODIPINE BESYLATE 5 MG PO TABS: 5.0000 mg | ORAL_TABLET | Freq: Every day | ORAL | 3 refills | Status: DC

## 2017-11-21 NOTE — Patient Instructions (Signed)
Follow up with PCP as directed  Continue to eat heart healthy diet (full of fruits, vegetables, whole grains, lean protein, water--limit salt, fat, and sugar intake) and increase physical activity as tolerated.   Continue doing brain stimulating activities (puzzles, reading, adult coloring books, staying active) to keep memory sharp.   Bring a copy of your living will and/or healthcare power of attorney to your next office visit.   Mr. Ruelas , Thank you for taking time to come for your Medicare Wellness Visit. I appreciate your ongoing commitment to your health goals. Please review the following plan we discussed and let me know if I can assist you in the future.   These are the goals we discussed: Goals    . Patient Stated     Maintain current healthy lifestyle.       This is a list of the screening recommended for you and due dates:  Health Maintenance  Topic Date Due  . Tetanus Vaccine  10/28/2017  . Flu Shot  03/21/2018  . Colon Cancer Screening  08/25/2018  . Pneumonia vaccines  Completed    Health Maintenance, Male A healthy lifestyle and preventive care is important for your health and wellness. Ask your health care provider about what schedule of regular examinations is right for you. What should I know about weight and diet? Eat a Healthy Diet  Eat plenty of vegetables, fruits, whole grains, low-fat dairy products, and lean protein.  Do not eat a lot of foods high in solid fats, added sugars, or salt.  Maintain a Healthy Weight Regular exercise can help you achieve or maintain a healthy weight. You should:  Do at least 150 minutes of exercise each week. The exercise should increase your heart rate and make you sweat (moderate-intensity exercise).  Do strength-training exercises at least twice a week.  Watch Your Levels of Cholesterol and Blood Lipids  Have your blood tested for lipids and cholesterol every 5 years starting at 79 years of age. If you are at  high risk for heart disease, you should start having your blood tested when you are 79 years old. You may need to have your cholesterol levels checked more often if: ? Your lipid or cholesterol levels are high. ? You are older than 79 years of age. ? You are at high risk for heart disease.  What should I know about cancer screening? Many types of cancers can be detected early and may often be prevented. Lung Cancer  You should be screened every year for lung cancer if: ? You are a current smoker who has smoked for at least 30 years. ? You are a former smoker who has quit within the past 15 years.  Talk to your health care provider about your screening options, when you should start screening, and how often you should be screened.  Colorectal Cancer  Routine colorectal cancer screening usually begins at 79 years of age and should be repeated every 5-10 years until you are 79 years old. You may need to be screened more often if early forms of precancerous polyps or small growths are found. Your health care provider may recommend screening at an earlier age if you have risk factors for colon cancer.  Your health care provider may recommend using home test kits to check for hidden blood in the stool.  A small camera at the end of a tube can be used to examine your colon (sigmoidoscopy or colonoscopy). This checks for the earliest forms of  colorectal cancer.  Prostate and Testicular Cancer  Depending on your age and overall health, your health care provider may do certain tests to screen for prostate and testicular cancer.  Talk to your health care provider about any symptoms or concerns you have about testicular or prostate cancer.  Skin Cancer  Check your skin from head to toe regularly.  Tell your health care provider about any new moles or changes in moles, especially if: ? There is a change in a mole's size, shape, or color. ? You have a mole that is larger than a pencil  eraser.  Always use sunscreen. Apply sunscreen liberally and repeat throughout the day.  Protect yourself by wearing long sleeves, pants, a wide-brimmed hat, and sunglasses when outside.  What should I know about heart disease, diabetes, and high blood pressure?  If you are 80-2 years of age, have your blood pressure checked every 3-5 years. If you are 77 years of age or older, have your blood pressure checked every year. You should have your blood pressure measured twice-once when you are at a hospital or clinic, and once when you are not at a hospital or clinic. Record the average of the two measurements. To check your blood pressure when you are not at a hospital or clinic, you can use: ? An automated blood pressure machine at a pharmacy. ? A home blood pressure monitor.  Talk to your health care provider about your target blood pressure.  If you are between 91-49 years old, ask your health care provider if you should take aspirin to prevent heart disease.  Have regular diabetes screenings by checking your fasting blood sugar level. ? If you are at a normal weight and have a low risk for diabetes, have this test once every three years after the age of 55. ? If you are overweight and have a high risk for diabetes, consider being tested at a younger age or more often.  A one-time screening for abdominal aortic aneurysm (AAA) by ultrasound is recommended for men aged 53-75 years who are current or former smokers. What should I know about preventing infection? Hepatitis B If you have a higher risk for hepatitis B, you should be screened for this virus. Talk with your health care provider to find out if you are at risk for hepatitis B infection. Hepatitis C Blood testing is recommended for:  Everyone born from [birth year] through 1967.  Anyone with known risk factors for hepatitis C.  Sexually Transmitted Diseases (STDs)  You should be screened each year for STDs including gonorrhea and  chlamydia if: ? You are sexually active and are younger than 79 years of age. ? You are older than 79 years of age and your health care provider tells you that you are at risk for this type of infection. ? Your sexual activity has changed since you were last screened and you are at an increased risk for chlamydia or gonorrhea. Ask your health care provider if you are at risk.  Talk with your health care provider about whether you are at high risk of being infected with HIV. Your health care provider may recommend a prescription medicine to help prevent HIV infection.  What else can I do?  Schedule regular health, dental, and eye exams.  Stay current with your vaccines (immunizations).  Do not use any tobacco products, such as cigarettes, chewing tobacco, and e-cigarettes. If you need help quitting, ask your health care provider.  Limit alcohol intake to no  more than 2 drinks per day. One drink equals 12 ounces of beer, 5 ounces of wine, or 1 ounces of hard liquor.  Do not use street drugs.  Do not share needles.  Ask your health care provider for help if you need support or information about quitting drugs.  Tell your health care provider if you often feel depressed.  Tell your health care provider if you have ever been abused or do not feel safe at home. This information is not intended to replace advice given to you by your health care provider. Make sure you discuss any questions you have with your health care provider. Document Released: 02/03/2008 Document Revised: 04/05/2016 Document Reviewed: 05/11/2015 Elsevier Interactive Patient Education  [employer].

## 2017-11-21 NOTE — Progress Notes (Signed)
Subjective:   Patient ID: Michael Kramer, male    DOB: [birth year], 77 y.o.   MRN: [id number]  Michael Kramer is a pleasant 77 y.o. year old male who presents to clinic today with Follow-up (Patient is here today for a F/U.  He will see Glenard Haring for an AWV when complete.  He had a bananna and coffee with non-fat milk this am only.  He denies any concerns at this time.  Last eye exam in Sept and has one scheduled for this Sept.  Hearing exam done today.)  on 11/21/2017  HPI:  HTN- BP has been controlled with Azor and norvasc. Denies CP, SOB or LE edema.   H/o bladder CA-  Sees urology yearly, Dr. Bernardo Heater. Denies any urinary complaints.  Lab Results  Component Value Date   ALT 26 11/15/2016   AST 26 11/15/2016   ALKPHOS 94 11/15/2016   BILITOT 0.6 11/15/2016   Lab Results  Component Value Date   NA 137 11/15/2016   K 4.0 11/15/2016   CL 100 11/15/2016   CO2 31 11/15/2016   Lab Results  Component Value Date   WBC 9.2 07/04/2016   HGB 15.4 07/04/2016   HCT 43.7 07/04/2016   MCV 89.0 07/04/2016   PLT 156 07/04/2016    Current Outpatient Medications on File Prior to Visit  Medication Sig Dispense Refill  . acetaminophen (TYLENOL) 500 MG tablet Take 500 mg by mouth every 6 (six) hours as needed. pain    . amLODipine (NORVASC) 5 MG tablet Take 1 tablet (5 mg total) by mouth daily. 90 tablet 3  . amLODipine-olmesartan (AZOR) 5-40 MG tablet Take 1 tablet by mouth daily. 90 tablet 3  . Docusate Calcium (STOOL SOFTENER PO) Take by mouth.    . fish oil-omega-3 fatty acids 1000 MG capsule Take 1,200 mg by mouth daily.    . lansoprazole (PREVACID) 15 MG capsule Take 1 capsule (15 mg total) by mouth daily. 90 capsule 3  . Multiple Vitamins-Minerals (MULTIVITAMIN WITH MINERALS) tablet Take 1 tablet by mouth daily.    . Psyllium (METAFIBER PO) Take by mouth.     No current facility-administered medications on file prior to visit.     No Known Allergies  Past Medical History:    Diagnosis Date  . Allergy   . Anxiety    white coat syndrome  . Arthritis   . BPH (benign prostatic hypertrophy)   . Cancer ([place])    bladder  . GERD (gastroesophageal reflux disease)   . Hypertension   . Shingles     Past Surgical History:  Procedure Laterality Date  . bladder cancer  10/2006   treated with TB Virus  . cartoid     normal  . CHOLECYSTECTOMY  1982  . COLONOSCOPY    . DOPPLER ECHOCARDIOGRAPHY  1997   normal  . ELECTROCARDIOGRAM    . holter moniter     normal  . POLYPECTOMY    . TONSILLECTOMY  1948   age 77 -23  . TRANSURETHRAL RESECTION OF PROSTATE      Family History  Problem Relation Age of Onset  . Emphysema Mother        smoker  . Hypertension Paternal Grandfather   . Colon cancer Neg Hx   . Colon polyps Neg Hx   . Esophageal cancer Neg Hx   . Rectal cancer Neg Hx   . Stomach cancer Neg Hx     Social History   Socioeconomic History  .  Marital status: Married    Spouse name: Not on file  . Number of children: 4  . Years of education: Not on file  . Highest education level: Not on file  Occupational History  . Occupation: retired Public house manager  . Financial resource strain: Not on file  . Food insecurity:    Worry: Not on file    Inability: Not on file  . Transportation needs:    Medical: Not on file    Non-medical: Not on file  Tobacco Use  . Smoking status: Former Smoker    Types: Cigarettes    Last attempt to quit: 01/20/1987    Years since quitting: 79  . Smokeless tobacco: Never Used  Substance and Sexual Activity  . Alcohol use: No    Alcohol/week: 0.0 oz  . Drug use: No  . Sexual activity: Never    Partners: Female  Lifestyle  . Physical activity:    Days per week: Not on file    Minutes per session: Not on file  . Stress: Not on file  Relationships  . Social connections:    Talks on phone: Not on file    Gets together: Not on file    Attends religious service: Not on file    Active member of club  or organization: Not on file    Attends meetings of clubs or organizations: Not on file    Relationship status: Not on file  . Intimate partner violence:    Fear of current or ex partner: Not on file    Emotionally abused: Not on file    Physically abused: Not on file    Forced sexual activity: Not on file  Other Topics Concern  . Not on file  Social History Narrative   Hobbies: golf and sports   Regular exercise--yes golf 3 times a week   Sleeps 8-9 hours per night         The PMH, PSH, Social History, Family History, Medications, and allergies have been reviewed in [facility], and have been updated if relevant.   Review of Systems  Constitutional: Negative.   HENT: Negative.   Eyes: Negative.   Respiratory: Negative.   Cardiovascular: Negative.   Gastrointestinal: Negative.   Genitourinary: Negative.   Musculoskeletal: Negative.   Neurological: Negative.   Hematological: Negative.   Psychiatric/Behavioral: Negative.   All other systems reviewed and are negative.      Objective:    BP (!) 142/66 (BP Location: Left Arm, Patient Position: Sitting, Cuff Size: Normal)   Pulse 88   Temp 98 F (36.7 C) (Oral)   Ht 5' 8.25" (1.734 m)   Wt 140 lb (63.5 kg)   SpO2 95%   BMI 21.13 kg/m   Wt Readings from Last 3 Encounters:  11/21/17 140 lb (63.5 kg)  08/29/17 144 lb (65.3 kg)  08/02/17 144 lb (65.3 kg)    Physical Exam  Constitutional: He is oriented to person, place, and time. He appears well-developed and well-nourished. No distress.  HENT:  Head: Normocephalic and atraumatic.  Eyes: Conjunctivae are normal.  Neck: Normal range of motion.  Cardiovascular: Normal rate and regular rhythm.  Pulmonary/Chest: Effort normal and breath sounds normal.  Musculoskeletal: Normal range of motion. He exhibits no edema.  Neurological: He is alert and oriented to person, place, and time. He displays normal reflexes. No cranial nerve deficit. Coordination normal.  Skin: Skin is warm  and dry. He is not diaphoretic.  Psychiatric: He has a  normal mood and affect. His behavior is normal. Judgment and thought content normal.  Nursing note and vitals reviewed.         Assessment & Plan:   Pure hypercholesterolemia - Plan: Comprehensive metabolic panel, Lipid panel  HYPERTENSION, BENIGN ESSENTIAL  History of bladder cancer  Screening for prostate cancer - Plan: PSA, Medicare No follow-ups on file.

## 2017-11-21 NOTE — Assessment & Plan Note (Signed)
No changes made to rxs- labs today. The patient indicates understanding of these issues and agrees with the plan.

## 2017-11-21 NOTE — Patient Instructions (Signed)
Great to see you. I will call you with your lab results from today and you can view them online.   

## 2017-11-21 NOTE — Assessment & Plan Note (Signed)
Mildly elevated but asymptomatic. No changes made.

## 2017-11-21 NOTE — Progress Notes (Signed)
I reviewed health advisor's note, was available for consultation, and agree with documentation and plan.  

## 2017-11-21 NOTE — Telephone Encounter (Signed)
RX's have been sent to pharm per pt req/thx dmf

## 2017-11-21 NOTE — Telephone Encounter (Signed)
While Mr. Heinlen (mrn [id number]) was having labs done he asked I send a message that he forgot to ask for refills on:  Prevacid, amlodipine & azor. Send to [facility] order pharmacy.  Thanks

## 2017-11-21 NOTE — Assessment & Plan Note (Signed)
Cystoscopy last fall. Following up in May with Dr. Bernardo Heater for atypical cells found in urine cytology.

## 2017-12-06 ENCOUNTER — Encounter: Payer: Self-pay | Admitting: Family Medicine

## 2018-01-03 ENCOUNTER — Ambulatory Visit: Payer: Medicare PPO | Admitting: Urology

## 2018-01-03 ENCOUNTER — Encounter: Payer: Self-pay | Admitting: Urology

## 2018-01-03 VITALS — BP 147/68 | HR 79 | Resp 16 | Ht 68.0 in | Wt 141.7 lb

## 2018-01-03 DIAGNOSIS — C679 Malignant neoplasm of bladder, unspecified: Secondary | ICD-10-CM | POA: Diagnosis not present

## 2018-01-03 LAB — URINALYSIS, COMPLETE
Bilirubin, UA: NEGATIVE
GLUCOSE, UA: NEGATIVE
Leukocytes, UA: NEGATIVE
Nitrite, UA: NEGATIVE
PROTEIN UA: NEGATIVE
RBC, UA: NEGATIVE
Specific Gravity, UA: 1.02 (ref 1.005–1.030)
Urobilinogen, Ur: 0.2 mg/dL (ref 0.2–1.0)
pH, UA: 6 (ref 5.0–7.5)

## 2018-01-03 MED ORDER — LIDOCAINE HCL URETHRAL/MUCOSAL 2 % EX GEL
1.0000 "application " | Freq: Once | CUTANEOUS | Status: AC
  Administered 2018-01-03: 1 via URETHRAL

## 2018-01-03 MED ORDER — CIPROFLOXACIN HCL 500 MG PO TABS
500.0000 mg | ORAL_TABLET | Freq: Once | ORAL | Status: AC
  Administered 2018-01-03: 500 mg via ORAL

## 2018-01-03 NOTE — Progress Notes (Signed)
   01/03/18  CC: No chief complaint on file.   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 Rosana Hoes in [place].  He underwent TURBT and received a 6-week course of BCG.  Recurrent low-grade tumors fulgurated August 2009 and follow-up surveillance cystoscopies were negative up until 2017.  He saw Dr. Jacqlyn Larsen at [facility] and December 2018 and had questionable early papillary lesion with atypical urine cytology.  Biopsy was performed which was negative for recurrent urothelial carcinoma.  Blood pressure (!) 147/68, pulse 79, resp. rate 16, height 5\' 8"  (1.727 m), weight 141 lb 11.2 oz (64.3 kg), SpO2 96 %. NED. A&Ox3.   No respiratory distress   Abd soft, NT, ND Normal phallus with bilateral descended testicles  Cystoscopy Procedure Note  Patient identification was confirmed, informed consent was obtained, and patient was prepped using Betadine solution.  Lidocaine jelly was administered per urethral meatus.    Preoperative abx where received prior to procedure.     Pre-Procedure: - Inspection reveals a normal caliber ureteral meatus.  Procedure: The flexible cystoscope was introduced without difficulty - No urethral strictures/lesions are present. - Moderate lateral lobe enlargement prostate  - Elevated bladder neck, mild - Bilateral ureteral orifices identified - Bladder mucosa  reveals no ulcers, tumors, or lesions - No bladder stones - No trabeculation  Retroflexion shows no significant abnormalities   Post-Procedure: - Patient tolerated the procedure well  Assessment/ Plan: No evidence of recurrent urothelial carcinoma on today's cystoscopy.  He desires to continue annual surveillance.

## 2018-01-06 ENCOUNTER — Encounter: Payer: Self-pay | Admitting: Urology

## 2018-01-09 ENCOUNTER — Ambulatory Visit: Payer: Self-pay | Admitting: Urology

## 2018-01-15 DIAGNOSIS — Z85828 Personal history of other malignant neoplasm of skin: Secondary | ICD-10-CM | POA: Diagnosis not present

## 2018-01-15 DIAGNOSIS — L570 Actinic keratosis: Secondary | ICD-10-CM | POA: Diagnosis not present

## 2018-01-15 DIAGNOSIS — L821 Other seborrheic keratosis: Secondary | ICD-10-CM | POA: Diagnosis not present

## 2018-01-15 DIAGNOSIS — D485 Neoplasm of uncertain behavior of skin: Secondary | ICD-10-CM | POA: Diagnosis not present

## 2018-01-15 DIAGNOSIS — D1801 Hemangioma of skin and subcutaneous tissue: Secondary | ICD-10-CM | POA: Diagnosis not present

## 2018-01-15 DIAGNOSIS — Z08 Encounter for follow-up examination after completed treatment for malignant neoplasm: Secondary | ICD-10-CM | POA: Diagnosis not present

## 2018-01-15 DIAGNOSIS — X32XXXA Exposure to sunlight, initial encounter: Secondary | ICD-10-CM | POA: Diagnosis not present

## 2018-01-21 ENCOUNTER — Ambulatory Visit: Payer: Self-pay | Admitting: Urology

## 2018-01-22 ENCOUNTER — Ambulatory Visit: Payer: Self-pay | Admitting: Urology

## 2018-01-24 DIAGNOSIS — L570 Actinic keratosis: Secondary | ICD-10-CM | POA: Diagnosis not present

## 2018-01-24 DIAGNOSIS — X32XXXA Exposure to sunlight, initial encounter: Secondary | ICD-10-CM | POA: Diagnosis not present

## 2018-05-16 DIAGNOSIS — C44311 Basal cell carcinoma of skin of nose: Secondary | ICD-10-CM | POA: Diagnosis not present

## 2018-05-16 DIAGNOSIS — D485 Neoplasm of uncertain behavior of skin: Secondary | ICD-10-CM | POA: Diagnosis not present

## 2018-05-23 DIAGNOSIS — H25813 Combined forms of age-related cataract, bilateral: Secondary | ICD-10-CM | POA: Diagnosis not present

## 2018-05-23 DIAGNOSIS — H43813 Vitreous degeneration, bilateral: Secondary | ICD-10-CM | POA: Diagnosis not present

## 2018-05-23 DIAGNOSIS — H524 Presbyopia: Secondary | ICD-10-CM | POA: Diagnosis not present

## 2018-06-03 DIAGNOSIS — L728 Other follicular cysts of the skin and subcutaneous tissue: Secondary | ICD-10-CM | POA: Diagnosis not present

## 2018-06-20 DIAGNOSIS — Z85828 Personal history of other malignant neoplasm of skin: Secondary | ICD-10-CM | POA: Diagnosis not present

## 2018-06-20 DIAGNOSIS — C44311 Basal cell carcinoma of skin of nose: Secondary | ICD-10-CM | POA: Diagnosis not present

## 2018-10-18 ENCOUNTER — Encounter: Payer: Self-pay | Admitting: Gastroenterology

## 2018-11-06 ENCOUNTER — Telehealth: Payer: Self-pay | Admitting: Family Medicine

## 2018-11-06 MED ORDER — AMLODIPINE BESYLATE 5 MG PO TABS: 5.0000 mg | ORAL_TABLET | Freq: Every day | ORAL | 0 refills | Status: DC

## 2018-11-06 NOTE — Telephone Encounter (Signed)
Copied from [place] [id number]. Topic: Quick Communication - Rx Refill/Question >> Nov 06, 2018 11:52 AM Keene Breath wrote: Medication: amLODipine (NORVASC) 5 MG tablet  Patient called to request a refill for the above early because he does not want to leave his house due to the [place] [address] situation.  Preferred Pharmacy (with phone number or street name): [facility], [place] [phone] (Phone) [phone] (Fax)

## 2018-11-06 NOTE — Telephone Encounter (Signed)
Rx sent to [facility] for 90d/thx dmf

## 2019-01-20 ENCOUNTER — Telehealth: Payer: Self-pay | Admitting: Family Medicine

## 2019-01-20 NOTE — Telephone Encounter (Signed)
Copied from [place] [id number]. Topic: Quick Communication - Rx Refill/Question >> Jan 20, 2019  1:48 PM Richardo Priest, NT wrote: Medication:  amLODipine-olmesartan (AZOR) 5-40 MG tablet   Has the patient contacted their pharmacy? Yes states no refills  Preferred Pharmacy (with phone number or street name):  [place], [place] [phone] (Phone) [phone] (Fax)  Agent: Please be advised that RX refills may take up to 3 business days. We ask that you follow-up with your pharmacy.

## 2019-01-21 ENCOUNTER — Ambulatory Visit (INDEPENDENT_AMBULATORY_CARE_PROVIDER_SITE_OTHER): Payer: Medicare PPO | Admitting: Family Medicine

## 2019-01-21 VITALS — BP 160/84 | HR 80 | Wt 143.5 lb

## 2019-01-21 DIAGNOSIS — I10 Essential (primary) hypertension: Secondary | ICD-10-CM

## 2019-01-21 DIAGNOSIS — K219 Gastro-esophageal reflux disease without esophagitis: Secondary | ICD-10-CM

## 2019-01-21 DIAGNOSIS — F4323 Adjustment disorder with mixed anxiety and depressed mood: Secondary | ICD-10-CM | POA: Diagnosis not present

## 2019-01-21 MED ORDER — AMLODIPINE BESYLATE 5 MG PO TABS: 5.0000 mg | ORAL_TABLET | Freq: Every day | ORAL | 0 refills | Status: DC

## 2019-01-21 MED ORDER — LANSOPRAZOLE 15 MG PO CPDR: 15.0000 mg | DELAYED_RELEASE_CAPSULE | Freq: Every day | ORAL | 3 refills | Status: DC

## 2019-01-21 MED ORDER — AMLODIPINE-OLMESARTAN 5-40 MG PO TABS: 1.0000 | ORAL_TABLET | Freq: Every day | ORAL | 3 refills | Status: DC

## 2019-01-21 NOTE — Telephone Encounter (Signed)
I have this pt scheduled for a visit with TA today/Rx will be sent in to pharmacy at that time/thx dmf

## 2019-01-21 NOTE — Assessment & Plan Note (Signed)
>  25 minutes spent in face to face time with patient, >50% spent in counselling or coordination of care discussing HTN, adjustment disorder, GERD with pt. His wife actually answered his GAD7 and PH9 questionnaires for him which I did not realize. He feels he is just sad about the state of the country right now with the riots and the pandemics and he knows he gets himself more worked up watching the news.  Denies SI or HI.  He says he does not feel depressed.  We agreed that he should watch less news coverage and get out and walk more. Call or send my chart message prn if these symptoms worsen or fail to improve as anticipated.  The patient indicates understanding of these issues and agrees with the plan.

## 2019-01-21 NOTE — Assessment & Plan Note (Signed)
Well controlled on prevacid.  eRx refills sent.
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
## 2019-01-21 NOTE — Assessment & Plan Note (Signed)
He feels it was more consistently controlled on trade name azor and norvasc. Elevated today but he is asymptomatic.  Has been running mainly in the 117B systolic. eRx refills sent as DAW. He will come in for labs. Orders Placed This Encounter  Procedures  . Comprehensive metabolic panel  . Lipid panel  . TSH

## 2019-01-28 ENCOUNTER — Other Ambulatory Visit (INDEPENDENT_AMBULATORY_CARE_PROVIDER_SITE_OTHER): Payer: Medicare PPO

## 2019-01-28 DIAGNOSIS — I10 Essential (primary) hypertension: Secondary | ICD-10-CM

## 2019-01-28 LAB — COMPREHENSIVE METABOLIC PANEL
ALT: 30 U/L (ref 0–53)
AST: 29 U/L (ref 0–37)
Albumin: 4.2 g/dL (ref 3.5–5.2)
Alkaline Phosphatase: 95 U/L (ref 39–117)
BUN: 12 mg/dL (ref 6–23)
CO2: 31 mEq/L (ref 19–32)
Calcium: 10.2 mg/dL (ref 8.4–10.5)
Chloride: 101 mEq/L (ref 96–112)
Creatinine, Ser: 0.83 mg/dL (ref 0.40–1.50)
GFR: 89.24 mL/min (ref >60.00)
Glucose, Bld: 100 mg/dL — ABNORMAL HIGH (ref 70–99)
Potassium: 3.6 mEq/L (ref 3.5–5.1)
Sodium: 139 mEq/L (ref 135–145)
Total Bilirubin: 0.8 mg/dL (ref 0.2–1.2)
Total Protein: 7.1 g/dL (ref 6.0–8.3)

## 2019-01-28 LAB — LIPID PANEL
Cholesterol: 171 mg/dL (ref 0–200)
HDL: 54 mg/dL (ref >39.00)
LDL Cholesterol: 92 mg/dL (ref 0–99)
NonHDL: 116.97
Total CHOL/HDL Ratio: 3
Triglycerides: 124 mg/dL (ref 0.0–149.0)
VLDL: 24.8 mg/dL (ref 0.0–40.0)

## 2019-01-28 LAB — TSH: TSH: 3.41 u[IU]/mL (ref 0.35–4.50)

## 2019-02-03 ENCOUNTER — Telehealth: Payer: Self-pay | Admitting: Family Medicine

## 2019-02-03 NOTE — Telephone Encounter (Signed)
Copied from [place] [id number]. Topic: Quick Communication - Rx Refill/Question >> Feb 03, 2019  3:11 PM Sheppard Coil, [employer] L wrote: Medication:  amLODipine-olmesartan (AZOR) 5-40 MG tablet  Pt states that he thought he was supposed to get this medication but what he actually got was three months of the generic.  Pt states he explained to Dr. Deborra Medina that he needed the name brand only because his results are not as good on generic and he needs to know what to do about this. Pt can be reached at [phone]

## 2019-02-04 MED ORDER — AMLODIPINE-OLMESARTAN 5-40 MG PO TABS: 1.0000 | ORAL_TABLET | Freq: Every day | ORAL | 3 refills | Status: DC

## 2019-02-04 NOTE — Telephone Encounter (Signed)
I called pt and informed that we did send in brand Azor with DAW on it/I advised that I resent with statement in SIG saying "pt rec generic and should be DAW"/pt will call pharmacy to discuss/thx dmf

## 2019-02-04 NOTE — Telephone Encounter (Signed)
Pt stated that [employer] told him they need direct authorization before they will fill rx for Azor even though they have the new rx. Please advise.

## 2019-02-12 ENCOUNTER — Telehealth: Payer: Self-pay | Admitting: Family Medicine

## 2019-02-12 NOTE — Telephone Encounter (Signed)
PA request received from [employer]/thx dmf

## 2019-02-12 NOTE — Telephone Encounter (Signed)
Medication Refill - Medication: amLODipine (NORVASC) 5 MG tablet   Has the patient contacted their pharmacy? Yes.   (Agent: If no, request that the patient contact the pharmacy for the refill.) (Agent: If yes, when and what did the pharmacy advise?) 0 fills  Preferred Pharmacy (with phone number or street name):  [facility], [place]  [place] [address]  Phone: [phone] Fax: [phone]   Agent: Please be advised that RX refills may take up to 3 business days. We ask that you follow-up with your pharmacy.

## 2019-02-12 NOTE — Telephone Encounter (Signed)
Patient says the pharmacy needs a PA on AZOR in order for it to be filled. They faxed the office today and patient would like confirmation.

## 2019-02-14 NOTE — Telephone Encounter (Signed)
Michael Kramer  from [facility] is calling to speak to nurse or PCP regarding medication Call back # [phone] REF# [phone]

## 2019-02-19 NOTE — Telephone Encounter (Signed)
Duplicate note

## 2019-02-20 NOTE — Telephone Encounter (Signed)
Pt requesting Amlodipine AZOR not norvasc, please advise  Requesting call back for status  [phone] VM okay

## 2019-02-28 NOTE — Telephone Encounter (Signed)
PA approved through 07/2019.

## 2019-04-23 ENCOUNTER — Other Ambulatory Visit: Payer: Self-pay | Admitting: Family Medicine

## 2019-04-23 MED ORDER — AMLODIPINE BESYLATE 5 MG PO TABS: 5.0000 mg | ORAL_TABLET | Freq: Every day | ORAL | 0 refills | Status: DC

## 2019-04-23 NOTE — Telephone Encounter (Signed)
Copied from [place] [id number]. Topic: Quick Communication - Rx Refill/Question >> Apr 23, 2019 11:36 AM Rainey Pines A wrote: Medication: amLODipine (NORVASC) 5 MG tablet (Patient requesting refill to be sent to pharmacy.)  Has the patient contacted their pharmacy? Yes (Agent: If no, request that the patient contact the pharmacy for the refill.) (Agent: If yes, when and what did the pharmacy advise?)Contact PCP   Preferred Pharmacy (with phone number or street name): [facility], [place] [phone] (Phone) [phone] (Fax)    Agent: Please be advised that RX refills may take up to 3 business days. We ask that you follow-up with your pharmacy.

## 2019-05-21 ENCOUNTER — Telehealth: Payer: Self-pay

## 2019-05-21 NOTE — Telephone Encounter (Signed)
Copied from [place] [id number]. Topic: General - Other >> May 13, 2019  1:00 PM [name] wrote: Reason for CRM: pt is calling to let dr Deborra Medina know his wife has a serious kidney issues and he will call back to schedule AWV in future when pandemic is under control

## 2019-05-22 NOTE — Telephone Encounter (Signed)
I am so sorry to hear that and I hope she is doing better.

## 2019-07-11 ENCOUNTER — Other Ambulatory Visit: Payer: Self-pay | Admitting: Family Medicine

## 2019-07-11 MED ORDER — AMLODIPINE BESYLATE 5 MG PO TABS: 5.0000 mg | ORAL_TABLET | Freq: Every day | ORAL | 0 refills | Status: DC

## 2019-07-11 NOTE — Telephone Encounter (Signed)
Medication: amLODipine (NORVASC) 5 MG tablet [id number]   Has the patient contacted their pharmacy? Yes  (Agent: If no, request that the patient contact the pharmacy for the refill.) (Agent: If yes, when and what did the pharmacy advise?)  Preferred Pharmacy (with phone number or street name): [place], [place] [phone] (Phone) [phone] (Fax)    Agent: Please be advised that RX refills may take up to 3 business days. We ask that you follow-up with your pharmacy.

## 2019-10-30 ENCOUNTER — Other Ambulatory Visit: Payer: Self-pay | Admitting: Family Medicine

## 2019-10-30 ENCOUNTER — Ambulatory Visit (INDEPENDENT_AMBULATORY_CARE_PROVIDER_SITE_OTHER): Payer: Medicare Other | Admitting: Family Medicine

## 2019-10-30 ENCOUNTER — Encounter: Payer: Self-pay | Admitting: Family Medicine

## 2019-10-30 ENCOUNTER — Telehealth: Payer: Self-pay | Admitting: Family Medicine

## 2019-10-30 ENCOUNTER — Other Ambulatory Visit: Payer: Self-pay

## 2019-10-30 VITALS — BP 131/66 | HR 92 | Temp 97.6°F | Ht 68.0 in | Wt 143.0 lb

## 2019-10-30 DIAGNOSIS — E781 Pure hyperglyceridemia: Secondary | ICD-10-CM

## 2019-10-30 DIAGNOSIS — K219 Gastro-esophageal reflux disease without esophagitis: Secondary | ICD-10-CM | POA: Diagnosis not present

## 2019-10-30 DIAGNOSIS — I10 Essential (primary) hypertension: Secondary | ICD-10-CM

## 2019-10-30 DIAGNOSIS — N400 Enlarged prostate without lower urinary tract symptoms: Secondary | ICD-10-CM

## 2019-10-30 DIAGNOSIS — Z136 Encounter for screening for cardiovascular disorders: Secondary | ICD-10-CM | POA: Diagnosis not present

## 2019-10-30 DIAGNOSIS — R739 Hyperglycemia, unspecified: Secondary | ICD-10-CM

## 2019-10-30 DIAGNOSIS — C679 Malignant neoplasm of bladder, unspecified: Secondary | ICD-10-CM

## 2019-10-30 DIAGNOSIS — Z7689 Persons encountering health services in other specified circumstances: Secondary | ICD-10-CM | POA: Diagnosis not present

## 2019-10-30 DIAGNOSIS — Z1211 Encounter for screening for malignant neoplasm of colon: Secondary | ICD-10-CM | POA: Diagnosis not present

## 2019-10-30 DIAGNOSIS — H6122 Impacted cerumen, left ear: Secondary | ICD-10-CM

## 2019-10-30 MED ORDER — OLMESARTAN MEDOXOMIL 40 MG PO TABS
40.0000 mg | ORAL_TABLET | Freq: Every day | ORAL | 0 refills | Status: DC
Start: 2019-10-30 — End: 2019-12-26

## 2019-10-30 MED ORDER — LANSOPRAZOLE 15 MG PO CPDR: 15.0000 mg | DELAYED_RELEASE_CAPSULE | Freq: Every day | ORAL | 3 refills | Status: DC

## 2019-10-30 MED ORDER — AMLODIPINE BESYLATE 10 MG PO TABS: 10.0000 mg | ORAL_TABLET | Freq: Every day | ORAL | 0 refills | Status: DC

## 2019-10-30 NOTE — Telephone Encounter (Signed)
Pt called back needing a new prescription for  Lansoprazole , said that Prevacid was $400.00

## 2019-10-30 NOTE — Assessment & Plan Note (Signed)
Currently well controlled on lansoprazole 15mg  once daily.  Plan: 1. Continue lansoprazole 15mg  once daily. Side effects discussed. Pt wants to continue med. 2. Avoid diet triggers. Reviewed need to seek care if globus sensation, difficulty swallowing, s/sx of GI bleed. 3. Follow up as needed and in 6 months.

## 2019-10-30 NOTE — Telephone Encounter (Signed)
I had sent in Lansoprazole earlier when he was here in clinic (it is the generic for Prevacid).  I resent in a generic Lansoprazole just now.  If it continues to be $400.00, can he ask [facility], what is the preferred Proton Pump Inhibitor for Tier 1 pricing and I will convert the prescription to something else.  Thanks

## 2019-10-30 NOTE — Patient Instructions (Addendum)
I have sent in a prescription for Amlodipine '10mg'$  and Olmesartan '40mg'$  to your pharmacy.  If this is still too high of a co-pay, ask [facility] [phone] for what medications are Tier 1 and contact us with a list and we can work on switching to a Tier 1 medication.  I have sent in a prescription for Cologuard today and for AAA Ultrasound.  If you have not received the kit in the mail in the next 2 weeks or a call to schedule the ultrasound, please contact our office.  We have cleaned out your left ear cerumen impaction.  You can use over the counter Debrox drops to help with wax building up in the future.  We have sent your blood work to the lab for processing and will contact you once we receive the results.  We will plan to see you back in 6 months for hypertension follow up  You will receive a survey after today's visit either digitally by e-mail or paper by [place] mail. Your experiences and feedback matter to [place].  Please respond so we know how we are doing as we provide care for you.  Call us with any questions/concerns/needs.  It is my goal to be available to you for your health concerns.  Thanks for choosing me to be a partner in your healthcare needs!  Harlin Rain, FNP-C Family Nurse Practitioner [facility] Phone: [phone]

## 2019-10-30 NOTE — Progress Notes (Signed)
Subjective:    Patient ID: Michael Kramer, male    DOB: [birth year], 79 y.o.   MRN: [id number]  Michael Kramer is a 79 y.o. male presenting on 10/30/2019 for Establish Care (hearing loss in the left ear. Pt concern it could be cerumen impaction in the Left ear. He would also like to change his blood pressure medication because its a Tier 3 medication and expensive.)   HPI  HEALTH MAINTENANCE:  Weight/BMI: Healthy weight and BMI Physical activity: Remains active Diet: heart healthy Seatbelt: 100% of the time when in a vehicle Prostate exam/PSA: Will do PSA labs AAA Screening: Not completed in the past, offered and accepted Colon cancer screening: Has had his last colonoscopy in 2017 with an every 3 year recommendation.  Reported by patient that GI had told him he did not have to repeat any additional but could move to Cologuard and if those come up positive he could have another colonoscopy done.  Cologuard ordered  He had followed with Dr. Bernardo Heater for approximately 9 years for his diagnosis of prostate cancer and back in May of 2019 he was released from care from Dr. Bernardo Heater and told to return as needed.  States he has had his previous PCP complete PSA labs yearly to monitor.  IMMUNIZATIONS: Influenza: COMPLETED 05/15/2020 Tetanus: DUE - Last 10/29/2007 - COVID: COMPLETED 08/27/2019 + 09/17/2019 Pneumonia: COMPLETED - PCV 23 - 10/28/2008 & PCV 13 11/15/2016   Depression screen PHQ 2/9 01/21/2019 11/21/2017 11/21/2017  Decreased Interest 3 0 0  Down, Depressed, Hopeless 0 0 0  PHQ - 2 Score 3 0 0  Altered sleeping 0 - -  Tired, decreased energy 3 - -  Change in appetite 2 - -  Feeling bad or failure about yourself  2 - -  Trouble concentrating 0 - -  Moving slowly or fidgety/restless 2 - -  Suicidal thoughts 0 - -  PHQ-9 Score 12 - -  Difficult doing work/chores Somewhat difficult - -    Past Medical History:  Diagnosis Date  . Allergy   . Anxiety    white coat syndrome  .  Arthritis   . Bladder cancer ([place])   . BPH (benign prostatic hypertrophy)   . Cancer ([place])    bladder  . Colon polyps   . GERD (gastroesophageal reflux disease)   . Hypertension   . Shingles    Past Surgical History:  Procedure Laterality Date  . bladder cancer  10/2006   treated with TB Virus  . cartoid     normal  . CHOLECYSTECTOMY  1982  . COLONOSCOPY    . DOPPLER ECHOCARDIOGRAPHY  1997   normal  . ELECTROCARDIOGRAM    . holter moniter     normal  . POLYPECTOMY    . skin cancer removal    . TONSILLECTOMY  1948   age 54 -82  . TRANSURETHRAL RESECTION OF PROSTATE     Social History   Socioeconomic History  . Marital status: Married    Spouse name: Not on file  . Number of children: 4  . Years of education: Not on file  . Highest education level: Not on file  Occupational History  . Occupation: retired Herbalist  Tobacco Use  . Smoking status: Former Smoker    Types: Cigarettes    Quit date: 01/20/1987    Years since quitting: 32.7  . Smokeless tobacco: Never Used  Substance and Sexual Activity  . Alcohol use: No  Alcohol/week: 0.0 standard drinks  . Drug use: No  . Sexual activity: Never    Partners: Female  Other Topics Concern  . Not on file  Social History Narrative   Hobbies: golf and sports   Regular exercise--yes golf 3 times a week   Sleeps 8-9 hours per night         Social Determinants of Health   Financial Resource Strain:   . Difficulty of Paying Living Expenses:   Food Insecurity:   . Worried About Charity fundraiser in the Last Year:   . Arboriculturist in the Last Year:   Transportation Needs:   . Film/video editor (Medical):   Marland Kitchen Lack of Transportation (Non-Medical):   Physical Activity:   . Days of Exercise per Week:   . Minutes of Exercise per Session:   Stress:   . Feeling of Stress :   Social Connections:   . Frequency of Communication with Friends and Family:   . Frequency of Social Gatherings with Friends and  Family:   . Attends Religious Services:   . Active Member of Clubs or Organizations:   . Attends Archivist Meetings:   Marland Kitchen Marital Status:   Intimate Partner Violence:   . Fear of Current or Ex-Partner:   . Emotionally Abused:   Marland Kitchen Physically Abused:   . Sexually Abused:    Family History  Problem Relation Age of Onset  . Emphysema Mother        smoker  . Hypertension Paternal Grandfather   . Heart disease Brother   . Colon cancer Neg Hx   . Colon polyps Neg Hx   . Esophageal cancer Neg Hx   . Rectal cancer Neg Hx   . Stomach cancer Neg Hx    Current Outpatient Medications on File Prior to Visit  Medication Sig  . acetaminophen (TYLENOL) 500 MG tablet Take 500 mg by mouth every 6 (six) hours as needed. pain  . amLODipine-olmesartan (AZOR) 5-40 MG tablet Take 1 tablet by mouth daily. (pt rec generic in mail and is to be DAW)  . fish oil-omega-3 fatty acids 1000 MG capsule Take 1,200 mg by mouth daily.  . Multiple Vitamins-Minerals (MULTIVITAMIN WITH MINERALS) tablet Take 1 tablet by mouth daily.  . Psyllium (METAFIBER PO) Take by mouth.   No current facility-administered medications on file prior to visit.    Per HPI unless specifically indicated above     Objective:    BP 131/66 (BP Location: Left Arm, Patient Position: Sitting, Cuff Size: Normal)   Pulse 92   Temp 97.6 F (36.4 C) (Temporal)   Ht 5\' 8"  (1.727 m)   Wt 143 lb (64.9 kg)   BMI 21.74 kg/m   Wt Readings from Last 3 Encounters:  10/30/19 143 lb (64.9 kg)  01/21/19 143 lb 8 oz (65.1 kg)  01/03/18 141 lb 11.2 oz (64.3 kg)    Physical Exam Vitals reviewed.  Constitutional:      General: He is not in acute distress.    Appearance: Normal appearance. He is well-groomed and normal weight. He is not ill-appearing or toxic-appearing.  HENT:     Head: Normocephalic.     Right Ear: Tympanic membrane, ear canal and external ear normal. There is no impacted cerumen.     Left Ear: Tympanic membrane,  ear canal and external ear normal. There is impacted cerumen.     Ears:     Comments: TM, canal visualized post cerumen  removal and WNL    Nose: Nose normal. No congestion or rhinorrhea.     Mouth/Throat:     Mouth: Mucous membranes are moist.     Pharynx: Oropharynx is clear. No oropharyngeal exudate or posterior oropharyngeal erythema.  Eyes:     General: Lids are normal. Vision grossly intact. No scleral icterus.       Right eye: No discharge or hordeolum.        Left eye: No discharge or hordeolum.     Extraocular Movements: Extraocular movements intact.     Conjunctiva/sclera: Conjunctivae normal.     Pupils: Pupils are equal, round, and reactive to light.  Neck:     Thyroid: No thyroid mass, thyromegaly or thyroid tenderness.  Cardiovascular:     Rate and Rhythm: Normal rate and regular rhythm.     Pulses: Normal pulses.          Dorsalis pedis pulses are 2+ on the right side and 2+ on the left side.       Posterior tibial pulses are 2+ on the right side and 2+ on the left side.     Heart sounds: Normal heart sounds. No murmur. No friction rub. No gallop.   Pulmonary:     Effort: Pulmonary effort is normal. No respiratory distress.     Breath sounds: Normal breath sounds.  Abdominal:     General: Abdomen is flat. Bowel sounds are normal. There is no distension or abdominal bruit.     Palpations: Abdomen is soft. There is no hepatomegaly, splenomegaly or mass.     Tenderness: There is no abdominal tenderness. There is no guarding or rebound.     Hernia: No hernia is present.  Musculoskeletal:        General: Normal range of motion.     Cervical back: Normal range of motion and neck supple. No tenderness.     Right lower leg: No edema.     Left lower leg: No edema.  Feet:     Right foot:     Skin integrity: Skin integrity normal.     Left foot:     Skin integrity: Skin integrity normal.  Lymphadenopathy:     Cervical: No cervical adenopathy.  Skin:    General: Skin is  warm and dry.     Capillary Refill: Capillary refill takes less than 2 seconds.  Neurological:     General: No focal deficit present.     Mental Status: He is alert and oriented to person, place, and time.     Cranial Nerves: Cranial nerves are intact.     Sensory: Sensation is intact.     Motor: Motor function is intact.     Coordination: Coordination is intact.     Gait: Gait is intact.     Deep Tendon Reflexes: Reflexes normal.  Psychiatric:        Attention and Perception: Attention and perception normal.        Mood and Affect: Mood and affect normal.        Speech: Speech normal.        Behavior: Behavior normal. Behavior is cooperative.        Thought Content: Thought content normal.        Cognition and Memory: Cognition and memory normal.        Judgment: Judgment normal.     Results for orders placed or performed in visit on 01/28/19  TSH  Result Value Ref Range   TSH 3.41  0.35 - 4.50 uIU/mL  Lipid panel  Result Value Ref Range   Cholesterol 171 0 - 200 mg/dL   Triglycerides 124.0 0.0 - 149.0 mg/dL   HDL 54.00 >39.00 mg/dL   VLDL 24.8 0.0 - 40.0 mg/dL   LDL Cholesterol 92 0 - 99 mg/dL   Total CHOL/HDL Ratio 3    NonHDL 116.97   Comprehensive metabolic panel  Result Value Ref Range   Sodium 139 135 - 145 mEq/L   Potassium 3.6 3.5 - 5.1 mEq/L   Chloride 101 96 - 112 mEq/L   CO2 31 19 - 32 mEq/L   Glucose, Bld 100 (H) 70 - 99 mg/dL   BUN 12 6 - 23 mg/dL   Creatinine, Ser 0.83 0.40 - 1.50 mg/dL   Total Bilirubin 0.8 0.2 - 1.2 mg/dL   Alkaline Phosphatase 95 39 - 117 U/L   AST 29 0 - 37 U/L   ALT 30 0 - 53 U/L   Total Protein 7.1 6.0 - 8.3 g/dL   Albumin 4.2 3.5 - 5.2 g/dL   Calcium 10.2 8.4 - 10.5 mg/dL   GFR 89.24 >60.00 mL/min      Assessment & Plan:   Problem List Items Addressed This Visit      Cardiovascular and Mediastinum   HYPERTENSION, BENIGN ESSENTIAL - Primary    Well controlled hypertension.  BP goal < 130/80.  Pt not working on lifestyle  modifications.  Taking medications tolerating well without side effects. Has been taking amlodipine 5mg  daily as well as amlodipine-olmesartan 5-40mg  daily.  Discussed changing of blood pressure medications to avoid the duplication of two different amlodipine prescriptions  Plan: 1. Prescription changed to amlodipine 10mg  daily and olmesartan 40mg  daily.  2. Obtain labs today  3. Encouraged heart healthy diet and increasing exercise to 30 minutes most days of the week, going no more than 2 days in a row without exercise. 4. Check BP 1-2 x per week at home, keep log, and bring to clinic at next appointment. 5. Follow up 6 months.        Relevant Medications   amLODipine (NORVASC) 10 MG tablet   olmesartan (BENICAR) 40 MG tablet   Other Relevant Orders   CBC with Differential   COMPLETE METABOLIC PANEL WITH GFR   POCT Urinalysis Dipstick   POCT UA - Microalbumin     Digestive   GERD (gastroesophageal reflux disease)    Currently well controlled on lansoprazole 15mg  once daily.  Plan: 1. Continue lansoprazole 15mg  once daily. Side effects discussed. Pt wants to continue med. 2. Avoid diet triggers. Reviewed need to seek care if globus sensation, difficulty swallowing, s/sx of GI bleed. 3. Follow up as needed and in 6 months.       Relevant Medications   lansoprazole (PREVACID) 15 MG capsule     Genitourinary   Malignant neoplasm of bladder ([place])     Other   Encounter to establish care with new doctor   Relevant Orders   CBC with Differential   COMPLETE METABOLIC PANEL WITH GFR   POCT Urinalysis Dipstick    Other Visit Diagnoses    Screening for AAA (abdominal aortic aneurysm)       Relevant Orders   VAS [place] AAA DUPLEX   Screening for malignant neoplasm of colon       Relevant Orders   Cologuard   Hyperglycemia       Relevant Orders   HgB A1c   POCT Urinalysis Dipstick   Benign  prostatic hyperplasia without lower urinary tract symptoms       Relevant Orders   PSA     Hypertriglyceridemia       Relevant Medications   amLODipine (NORVASC) 10 MG tablet   olmesartan (BENICAR) 40 MG tablet   Other Relevant Orders   Lipid Profile   Left ear impacted cerumen       Relevant Orders   Ear Lavage      Meds ordered this encounter  Medications  . amLODipine (NORVASC) 10 MG tablet    Sig: Take 1 tablet (10 mg total) by mouth daily.    Dispense:  90 tablet    Refill:  0  . olmesartan (BENICAR) 40 MG tablet    Sig: Take 1 tablet (40 mg total) by mouth daily.    Dispense:  90 tablet    Refill:  0  . lansoprazole (PREVACID) 15 MG capsule    Sig: Take 1 capsule (15 mg total) by mouth daily.    Dispense:  90 capsule    Refill:  3      Follow up plan: Return in about 6 months (around 05/01/2020) for HTN.  Harlin Rain, FNP-C Family Nurse Practitioner [facility] 10/30/2019, 10:49 AM

## 2019-10-30 NOTE — Assessment & Plan Note (Signed)
Well controlled hypertension.  BP goal < 130/80.  Pt not working on lifestyle modifications.  Taking medications tolerating well without side effects. Has been taking amlodipine 5mg  daily as well as amlodipine-olmesartan 5-40mg  daily.  Discussed changing of blood pressure medications to avoid the duplication of two different amlodipine prescriptions  Plan: 1. Prescription changed to amlodipine 10mg  daily and olmesartan 40mg  daily.  2. Obtain labs today  3. Encouraged heart healthy diet and increasing exercise to 30 minutes most days of the week, going no more than 2 days in a row without exercise. 4. Check BP 1-2 x per week at home, keep log, and bring to clinic at next appointment. 5. Follow up 6 months.

## 2019-10-30 NOTE — Telephone Encounter (Signed)
I spoke with the patient and he stated that he will f/u with [facility] at a later date and get back with [place]. He doesn't feel like calling them again today. He also state that he currently have plenty of medications to get him through.

## 2019-11-03 ENCOUNTER — Other Ambulatory Visit: Payer: Medicare Other

## 2019-11-03 ENCOUNTER — Other Ambulatory Visit: Payer: Self-pay

## 2019-11-04 ENCOUNTER — Encounter: Payer: Self-pay | Admitting: Family Medicine

## 2019-11-04 ENCOUNTER — Telehealth: Payer: Self-pay

## 2019-11-04 DIAGNOSIS — R7303 Prediabetes: Secondary | ICD-10-CM | POA: Insufficient documentation

## 2019-11-04 LAB — COMPLETE METABOLIC PANEL WITH GFR
AG Ratio: 1.7 (calc) (ref 1.0–2.5)
ALT: 31 U/L (ref 9–46)
AST: 29 U/L (ref 10–35)
Albumin: 4.2 g/dL (ref 3.6–5.1)
Alkaline phosphatase (APISO): 101 U/L (ref 35–144)
BUN: 12 mg/dL (ref 7–25)
CO2: 33 mmol/L — ABNORMAL HIGH (ref 20–32)
Calcium: 10.2 mg/dL (ref 8.6–10.3)
Chloride: 102 mmol/L (ref 98–110)
Creat: 0.88 mg/dL (ref 0.70–1.11)
GFR, Est African American: 94 mL/min/{1.73_m2} (ref >=60)
GFR, Est Non African American: 81 mL/min/{1.73_m2} (ref >=60)
Globulin: 2.5 g/dL (calc) (ref 1.9–3.7)
Glucose, Bld: 120 mg/dL — ABNORMAL HIGH (ref 65–99)
Potassium: 3.7 mmol/L (ref 3.5–5.3)
Sodium: 141 mmol/L (ref 135–146)
Total Bilirubin: 0.7 mg/dL (ref 0.2–1.2)
Total Protein: 6.7 g/dL (ref 6.1–8.1)

## 2019-11-04 LAB — LIPID PANEL
Cholesterol: 176 mg/dL (ref <200)
HDL: 50 mg/dL (ref >=40)
LDL Cholesterol (Calc): 101 mg/dL (calc) — ABNORMAL HIGH
Non-HDL Cholesterol (Calc): 126 mg/dL (calc) (ref <130)
Total CHOL/HDL Ratio: 3.5 (calc) (ref <5.0)
Triglycerides: 148 mg/dL (ref <150)

## 2019-11-04 LAB — POCT URINALYSIS DIPSTICK
Bilirubin, UA: NEGATIVE
Blood, UA: NEGATIVE
Glucose, UA: NEGATIVE
Ketones, UA: NEGATIVE
Leukocytes, UA: NEGATIVE
Nitrite, UA: NEGATIVE
Protein, UA: NEGATIVE
Spec Grav, UA: 1.015 (ref 1.010–1.025)
Urobilinogen, UA: 0.2 E.U./dL
pH, UA: 7 (ref 5.0–8.0)

## 2019-11-04 LAB — CBC WITH DIFFERENTIAL/PLATELET
Absolute Monocytes: 656 cells/uL (ref 200–950)
Basophils Absolute: 49 cells/uL (ref 0–200)
Basophils Relative: 0.6 %
Eosinophils Absolute: 162 cells/uL (ref 15–500)
Eosinophils Relative: 2 %
HCT: 48.1 % (ref 38.5–50.0)
Hemoglobin: 16 g/dL (ref 13.2–17.1)
Lymphs Abs: 2300 cells/uL (ref 850–3900)
MCH: 31.2 pg (ref 27.0–33.0)
MCHC: 33.3 g/dL (ref 32.0–36.0)
MCV: 93.8 fL (ref 80.0–100.0)
MPV: 11.9 fL (ref 7.5–12.5)
Monocytes Relative: 8.1 %
Neutro Abs: 4933 cells/uL (ref 1500–7800)
Neutrophils Relative %: 60.9 %
Platelets: 182 10*3/uL (ref 140–400)
RBC: 5.13 10*6/uL (ref 4.20–5.80)
RDW: 12.5 % (ref 11.0–15.0)
Total Lymphocyte: 28.4 %
WBC: 8.1 10*3/uL (ref 3.8–10.8)

## 2019-11-04 LAB — HEMOGLOBIN A1C
Hgb A1c MFr Bld: 6 % of total Hgb — ABNORMAL HIGH (ref <5.7)
Mean Plasma Glucose: 126 (calc)
eAG (mmol/L): 7 (calc)

## 2019-11-04 LAB — PSA: PSA: 0.7 ng/mL (ref <=4.0)

## 2019-11-04 NOTE — Progress Notes (Signed)
His labs look good except for his A1C.  Can he schedule a virtual visit or an in person to discuss his A1C and the treatment plan we can make together? Thanks

## 2019-11-04 NOTE — Progress Notes (Signed)
Discussed lab results with Michael Kramer.  Verbalized understanding of lab results and prediabetes diagnosis, will begin lifestyle modifications for diet/exercise.

## 2019-11-04 NOTE — Addendum Note (Signed)
Addended by: Wilson Singer on: 11/04/2019 11:31 AM   Modules accepted: Orders

## 2019-11-04 NOTE — Telephone Encounter (Signed)
The pt was notified of his lab results.

## 2019-11-06 ENCOUNTER — Other Ambulatory Visit: Payer: Self-pay | Admitting: Family Medicine

## 2019-11-06 DIAGNOSIS — I10 Essential (primary) hypertension: Secondary | ICD-10-CM

## 2019-11-06 DIAGNOSIS — R7303 Prediabetes: Secondary | ICD-10-CM

## 2019-11-06 DIAGNOSIS — R0683 Snoring: Secondary | ICD-10-CM

## 2019-11-06 NOTE — Progress Notes (Signed)
Home sleep study ordered.  Discussed with patient.

## 2019-11-19 ENCOUNTER — Ambulatory Visit (INDEPENDENT_AMBULATORY_CARE_PROVIDER_SITE_OTHER): Payer: Medicare Other

## 2019-11-19 ENCOUNTER — Other Ambulatory Visit: Payer: Self-pay

## 2019-11-19 DIAGNOSIS — Z136 Encounter for screening for cardiovascular disorders: Secondary | ICD-10-CM

## 2019-11-25 NOTE — Progress Notes (Signed)
Is considered within normal limits.  We won't have to repeat this again in the future.  Thanks

## 2019-11-26 ENCOUNTER — Telehealth: Payer: Self-pay

## 2019-11-26 NOTE — Telephone Encounter (Signed)
The pt was notified of his AAA Ultrasound results. He verbalize understanding, no questions or concerns.

## 2019-12-26 ENCOUNTER — Other Ambulatory Visit: Payer: Self-pay | Admitting: Family Medicine

## 2019-12-26 DIAGNOSIS — I10 Essential (primary) hypertension: Secondary | ICD-10-CM

## 2019-12-26 MED ORDER — OLMESARTAN MEDOXOMIL 40 MG PO TABS: 40.0000 mg | ORAL_TABLET | Freq: Every day | ORAL | 0 refills | Status: DC

## 2019-12-26 MED ORDER — ACETAMINOPHEN 500 MG PO TABS: 500.0000 mg | ORAL_TABLET | Freq: Four times a day (QID) | ORAL | 0 refills | Status: AC | PRN

## 2019-12-26 NOTE — Telephone Encounter (Signed)
Medication: olmesartan (BENICAR) 40 MG tablet [id number] , amLODipine (NORVASC) 10 MG tablet [id number]   Has the patient contacted their pharmacy? Yes  (Agent: If no, request that the patient contact the pharmacy for the refill.) (Agent: If yes, when and what did the pharmacy advise?)  Preferred Pharmacy (with phone number or street name): [facility] - [address], [place]  Phone:  [phone] Fax:  [phone]     Agent: Please be advised that RX refills may take up to 3 business days. We ask that you follow-up with your pharmacy.

## 2020-01-02 ENCOUNTER — Other Ambulatory Visit: Payer: Self-pay | Admitting: Family Medicine

## 2020-01-02 DIAGNOSIS — I10 Essential (primary) hypertension: Secondary | ICD-10-CM

## 2020-01-02 MED ORDER — AMLODIPINE BESYLATE 10 MG PO TABS: 10.0000 mg | ORAL_TABLET | Freq: Every day | ORAL | 0 refills | Status: DC

## 2020-01-02 NOTE — Telephone Encounter (Signed)
Medication Refill - Medication:  amLODipine (NORVASC) 10 MG tablet  Has the patient contacted their pharmacy? Yes advised to call. Pt got acetaminophen (TYLENOL) 500 MG tablet  instead.   Preferred Pharmacy (with phone number or street name):  [facility] - [address], [place] Phone:  [phone]  Fax:  [phone]       Agent: Please be advised that RX refills may take up to 3 business days. We ask that you follow-up with your pharmacy.

## 2020-01-02 NOTE — Telephone Encounter (Signed)
Per OV 10/30/19- increased Rx to 10 mg- f/u 6 months

## 2020-03-25 ENCOUNTER — Other Ambulatory Visit: Payer: Self-pay | Admitting: Family Medicine

## 2020-03-25 DIAGNOSIS — I10 Essential (primary) hypertension: Secondary | ICD-10-CM

## 2020-04-01 ENCOUNTER — Other Ambulatory Visit: Payer: Self-pay | Admitting: Family Medicine

## 2020-04-01 DIAGNOSIS — I10 Essential (primary) hypertension: Secondary | ICD-10-CM

## 2020-04-30 ENCOUNTER — Ambulatory Visit: Payer: Self-pay

## 2020-04-30 ENCOUNTER — Ambulatory Visit: Payer: Medicare Other | Attending: Internal Medicine

## 2020-04-30 DIAGNOSIS — Z23 Encounter for immunization: Secondary | ICD-10-CM

## 2020-05-03 NOTE — Progress Notes (Signed)
   Covid-19 Vaccination Clinic  Name:  Michael Kramer    MRN: [id number] DOB: [birth year]  05/03/2020  [name] was observed post Covid-19 immunization for 15 minutes without incident. He was provided with Vaccine Information Sheet and instruction to access the V-Safe system.   [name] was instructed to call 911 with any severe reactions post vaccine: Marland Kitchen Difficulty breathing  . Swelling of face and throat  . A fast heartbeat  . A bad rash all over body  . Dizziness and weakness

## 2020-05-07 ENCOUNTER — Ambulatory Visit: Payer: Self-pay

## 2020-06-23 ENCOUNTER — Telehealth: Payer: Self-pay | Admitting: Family Medicine

## 2020-06-23 DIAGNOSIS — I10 Essential (primary) hypertension: Secondary | ICD-10-CM

## 2020-06-23 NOTE — Telephone Encounter (Signed)
Sent back notification to pharmacy patient needs OV for refills

## 2020-06-23 NOTE — Telephone Encounter (Signed)
Requested medications are due for refill today?  Yes  Requested medications are on active medication list?  Yes  Last Refill:  03/25/2020  # 90 with no refills    Future visit scheduled?  No   Notes to Clinic:  Medication failed Rx refill protocol due to no valid encounter in the past 6 months and no labs within the past 180 days.

## 2020-06-30 ENCOUNTER — Other Ambulatory Visit: Payer: Self-pay | Admitting: Family Medicine

## 2020-06-30 DIAGNOSIS — I10 Essential (primary) hypertension: Secondary | ICD-10-CM

## 2020-06-30 NOTE — Telephone Encounter (Signed)
Requested medications are due for refill today?  Yes  Requested medications are on active medication list?  Yes  Last Refill:  04/01/2020  # 90 with no refills   Future visit scheduled?   No   Notes to Clinic:  Medication failed Rx refill protocol due to no valid encounter in the past 9 months.  Last office visit was on 10/30/2019.  At that time patient was taking amlodipine as well as amlodipine - olmesartan.  During that office visit it was decided to increase his amlodipine to 10 mg and order his olmesartan separately.  The amlodipine - olmesartan remain on patient's active medication list.  Please review.    Patient needs an appointment for an office visit.

## 2020-07-21 ENCOUNTER — Ambulatory Visit: Payer: Medicare Other | Admitting: Family Medicine

## 2020-07-27 ENCOUNTER — Encounter: Payer: Self-pay | Admitting: Family Medicine

## 2020-07-27 ENCOUNTER — Ambulatory Visit (INDEPENDENT_AMBULATORY_CARE_PROVIDER_SITE_OTHER): Payer: Medicare Other | Admitting: Family Medicine

## 2020-07-27 ENCOUNTER — Other Ambulatory Visit: Payer: Self-pay

## 2020-07-27 VITALS — BP 128/66 | HR 93 | Resp 18 | Ht 68.0 in | Wt 136.2 lb

## 2020-07-27 DIAGNOSIS — I10 Essential (primary) hypertension: Secondary | ICD-10-CM

## 2020-07-27 DIAGNOSIS — R7303 Prediabetes: Secondary | ICD-10-CM | POA: Diagnosis not present

## 2020-07-27 DIAGNOSIS — K219 Gastro-esophageal reflux disease without esophagitis: Secondary | ICD-10-CM

## 2020-07-27 LAB — POCT GLYCOSYLATED HEMOGLOBIN (HGB A1C): Hemoglobin A1C: 6.2 % — AB (ref 4.0–5.6)

## 2020-07-27 MED ORDER — OLMESARTAN MEDOXOMIL 40 MG PO TABS: 40.0000 mg | ORAL_TABLET | Freq: Every day | ORAL | 3 refills | Status: AC

## 2020-07-27 MED ORDER — LANSOPRAZOLE 15 MG PO CPDR: 15.0000 mg | DELAYED_RELEASE_CAPSULE | Freq: Every day | ORAL | 3 refills | Status: AC

## 2020-07-27 MED ORDER — AMLODIPINE BESYLATE 10 MG PO TABS: 10.0000 mg | ORAL_TABLET | Freq: Every day | ORAL | 3 refills | Status: AC

## 2020-07-27 NOTE — Patient Instructions (Signed)
Your medication refills have been sent to your pharmacy on file.  Try to get exercise a minimum of 30 minutes per day at least 5 days per week as well as  adequate water intake all while measuring blood pressure a few times per week.  Keep a blood pressure log and bring back to clinic at your next visit.  If your readings are consistently over 130/80 to contact our office/send me a MyChart message and we will see you sooner.  Can try DASH and Mediterranean diet options, avoiding processed foods, lowering sodium intake, avoiding pork products, and eating a plant based diet for optimal health.  We will plan to see you back in 6 months for hypertension follow up visit and lab work  You will receive a survey after today's visit either digitally by e-mail or paper by [employer]. Your experiences and feedback matter to [place].  Please respond so we know how we are doing as we provide care for you.  Call us with any questions/concerns/needs.  It is my goal to be available to you for your health concerns.  Thanks for choosing me to be a partner in your healthcare needs!  Harlin Rain, FNP-C Family Nurse Practitioner [facility] Phone: [phone]

## 2020-07-27 NOTE — Progress Notes (Signed)
Subjective:    Patient ID: Michael Kramer, male    DOB: [birth year], 79 y.o.   MRN: [id number]  Michael Kramer is a 79 y.o. male presenting on 07/27/2020 for Hypertension  HPI   Michael Kramer presents to clinic for hypertension, GERD and prediabetes follow up visit.  Denies any acute concerns today.  Hypertension - He is not checking BP at home or outside of clinic.    - Current medications: amlodipine 10mg  daily and olmesartan 40mg  daily, tolerating well without side effects - He is not currently symptomatic. - Pt denies headache, lightheadedness, dizziness, changes in vision, chest tightness/pressure, palpitations, leg swelling, sudden loss of speech or loss of consciousness. - He  reports no regular exercise routine. - His diet is high in salt, high in fat, and high in carbohydrates.    Depression screen [facility] 2/9 01/21/2019 11/21/2017 11/21/2017  Decreased Interest 3 0 0  Down, Depressed, Hopeless 0 0 0  PHQ - 2 Score 3 0 0  Altered sleeping 0 - -  Tired, decreased energy 3 - -  Change in appetite 2 - -  Feeling bad or failure about yourself  2 - -  Trouble concentrating 0 - -  Moving slowly or fidgety/restless 2 - -  Suicidal thoughts 0 - -  PHQ-9 Score 12 - -  Difficult doing work/chores Somewhat difficult - -    Social History   Tobacco Use  . Smoking status: Former Smoker    Types: Cigarettes    Quit date: 01/20/1987    Years since quitting: 33.5  . Smokeless tobacco: Never Used  Vaping Use  . Vaping Use: Never used  Substance Use Topics  . Alcohol use: No    Alcohol/week: 0.0 standard drinks  . Drug use: No    Review of Systems  Constitutional: Negative.   HENT: Negative.   Eyes: Negative.   Respiratory: Negative.   Cardiovascular: Negative.   Gastrointestinal: Negative.   Endocrine: Negative.   Genitourinary: Negative.   Musculoskeletal: Negative.   Skin: Negative.   Allergic/Immunologic: Negative.   Neurological: Negative.   Hematological: Negative.     Psychiatric/Behavioral: Negative.    Per HPI unless specifically indicated above     Objective:    BP 128/66 (BP Location: Right Arm, Patient Position: Sitting, Cuff Size: Normal)   Pulse 93   Resp 18   Ht 5\' 8"  (1.727 m)   Wt 136 lb 3.2 oz (61.8 kg)   SpO2 98%   BMI 20.71 kg/m   Wt Readings from Last 3 Encounters:  07/27/20 136 lb 3.2 oz (61.8 kg)  10/30/19 143 lb (64.9 kg)  01/21/19 143 lb 8 oz (65.1 kg)    Physical Exam Vitals and nursing note reviewed.  Constitutional:      General: He is not in acute distress.    Appearance: Normal appearance. He is well-developed and well-groomed. He is not ill-appearing or toxic-appearing.  HENT:     Head: Normocephalic and atraumatic.     Nose:     Comments: Lizbeth Bark is in place, covering mouth and nose. Eyes:     General:        Right eye: No discharge.        Left eye: No discharge.     Extraocular Movements: Extraocular movements intact.     Conjunctiva/sclera: Conjunctivae normal.     Pupils: Pupils are equal, round, and reactive to light.  Cardiovascular:     Rate and Rhythm: Normal rate and regular  rhythm.     Pulses: Normal pulses.     Heart sounds: Normal heart sounds. No murmur heard.  No friction rub. No gallop.   Pulmonary:     Effort: Pulmonary effort is normal. No respiratory distress.     Breath sounds: Normal breath sounds.  Skin:    General: Skin is warm and dry.     Capillary Refill: Capillary refill takes less than 2 seconds.  Neurological:     General: No focal deficit present.     Mental Status: He is alert and oriented to person, place, and time.  Psychiatric:        Attention and Perception: Attention and perception normal.        Mood and Affect: Mood and affect normal.        Speech: Speech normal.        Behavior: Behavior normal. Behavior is cooperative.        Thought Content: Thought content normal.        Cognition and Memory: Cognition and memory normal.    Results for orders placed or  performed in visit on 07/27/20  POCT HgB A1C  Result Value Ref Range   Hemoglobin A1C 6.2 (A) 4.0 - 5.6 %      Assessment & Plan:   Problem List Items Addressed This Visit      Cardiovascular and Mediastinum   HYPERTENSION, BENIGN ESSENTIAL    Controlled hypertension.  BP is at goal < 130/80.  Pt working on lifestyle modifications.  Taking medications tolerating well without side effects.  Complications:  GERD, Prediabetes  Plan: 1. Continue taking amlodipine 10mg  daily and olmesartan 40mg  daily 2. Obtain labs at next visit  3. Encouraged heart healthy diet and increasing exercise to 30 minutes most days of the week, going no more than 2 days in a row without exercise. 4. Check BP 1-2 x per week at home, keep log, and bring to clinic at next appointment. 5. Follow up 6 months.       Relevant Medications   amLODipine (NORVASC) 10 MG tablet   olmesartan (BENICAR) 40 MG tablet     Digestive   GERD (gastroesophageal reflux disease)    Currently well controlled on Lansoprazole 15mg  once daily.  Plan: 1. Continue lansoprazole 15mg  once daily. Side effects discussed. Pt wants to continue med. 2. Avoid diet triggers. Reviewed need to seek care if globus sensation, difficulty swallowing, s/sx of GI bleed. 3. Follow up as needed and in 6 months.       Relevant Medications   lansoprazole (PREVACID) 15 MG capsule     Other   Prediabetes - Primary    A1C currently 6.2%, worsened from 6.0% on 11/03/2019.  Reports improvement in lifestyle and dietary modifications, but does endorse diet inclusive of increased white carbohydrates and stress.  Encouraged to switch to brown rice, brown pasta, sweet potatoes for white potatoes, and will re-evaluate A1C again in 6 months.      Relevant Orders   POCT HgB A1C (Completed)      Meds ordered this encounter  Medications  . amLODipine (NORVASC) 10 MG tablet    Sig: Take 1 tablet (10 mg total) by mouth daily.    Dispense:  90 tablet     Refill:  3  . olmesartan (BENICAR) 40 MG tablet    Sig: Take 1 tablet (40 mg total) by mouth daily.    Dispense:  90 tablet    Refill:  3  . lansoprazole (  PREVACID) 15 MG capsule    Sig: Take 1 capsule (15 mg total) by mouth daily at 12 noon.    Dispense:  90 capsule    Refill:  3   Follow up plan: Return in about 6 months (around 01/25/2021) for HTN F/U & Labs.   Harlin Rain, [place] [facility] Nurse Practitioner [facility] 07/27/2020, 4:16 PM

## 2020-07-27 NOTE — Assessment & Plan Note (Signed)
Controlled hypertension.  BP is at goal < 130/80.  Pt working on lifestyle modifications.  Taking medications tolerating well without side effects.  Complications:  GERD, Prediabetes  Plan: 1. Continue taking amlodipine 10mg  daily and olmesartan 40mg  daily 2. Obtain labs at next visit  3. Encouraged heart healthy diet and increasing exercise to 30 minutes most days of the week, going no more than 2 days in a row without exercise. 4. Check BP 1-2 x per week at home, keep log, and bring to clinic at next appointment. 5. Follow up 6 months.

## 2020-07-27 NOTE — Assessment & Plan Note (Signed)
A1C currently 6.2%, worsened from 6.0% on 11/03/2019.  Reports improvement in lifestyle and dietary modifications, but does endorse diet inclusive of increased white carbohydrates and stress.  Encouraged to switch to brown rice, brown pasta, sweet potatoes for white potatoes, and will re-evaluate A1C again in 6 months.

## 2020-07-27 NOTE — Assessment & Plan Note (Signed)
Currently well controlled on Lansoprazole 15mg  once daily.  Plan: 1. Continue lansoprazole 15mg  once daily. Side effects discussed. Pt wants to continue med. 2. Avoid diet triggers. Reviewed need to seek care if globus sensation, difficulty swallowing, s/sx of GI bleed. 3. Follow up as needed and in 6 months.

## 2022-06-19 ENCOUNTER — Encounter (INDEPENDENT_AMBULATORY_CARE_PROVIDER_SITE_OTHER): Payer: Self-pay
